# Patient Record
Sex: Female | Born: 1970 | Race: Black or African American | Hispanic: No | Smoking: Never smoker
Health system: Southern US, Community
[De-identification: ages and names within clinical notes are randomized; demographics above are authoritative.]

## PROBLEM LIST (undated history)

## (undated) DIAGNOSIS — K56609 Unspecified intestinal obstruction, unspecified as to partial versus complete obstruction: Secondary | ICD-10-CM

## (undated) HISTORY — PX: OTHER SURGICAL HISTORY: SHX169

## (undated) HISTORY — PX: GASTRIC BYPASS: SHX52

---

## 2012-12-11 ENCOUNTER — Emergency Department: Payer: Self-pay | Admitting: Emergency Medicine

## 2012-12-11 LAB — URINALYSIS, COMPLETE
Bacteria: NEGATIVE
Blood: NEGATIVE
Ketone: NEGATIVE
Nitrite: NEGATIVE
Ph: 5 (ref 4.5–8.0)
RBC,UR: NONE SEEN /HPF (ref 0–5)
Specific Gravity: 1.036 (ref 1.003–1.030)
Squamous Epithelial: NONE SEEN
WBC UR: NONE SEEN /HPF (ref 0–5)

## 2012-12-11 LAB — COMPREHENSIVE METABOLIC PANEL
Albumin: 3.5 g/dL (ref 3.4–5.0)
Alkaline Phosphatase: 80 U/L (ref 50–136)
Anion Gap: 5 — ABNORMAL LOW (ref 7–16)
BUN: 7 mg/dL (ref 7–18)
Bilirubin,Total: <0.1 mg/dL — ABNORMAL LOW (ref 0.2–1.0)
Calcium, Total: 8.6 mg/dL (ref 8.5–10.1)
Co2: 26 mmol/L (ref 21–32)
Creatinine: 0.68 mg/dL (ref 0.60–1.30)
EGFR (African American): >60
EGFR (Non-African Amer.): >60
Osmolality: 281 (ref 275–301)
Potassium: 3.9 mmol/L (ref 3.5–5.1)
SGOT(AST): 30 U/L (ref 15–37)
Sodium: 141 mmol/L (ref 136–145)
Total Protein: 7.3 g/dL (ref 6.4–8.2)

## 2012-12-11 LAB — CBC
HCT: 36 % (ref 35.0–47.0)
MCH: 27.8 pg (ref 26.0–34.0)
RBC: 4.28 10*6/uL (ref 3.80–5.20)
RDW: 14 % (ref 11.5–14.5)
WBC: 9.8 10*3/uL (ref 3.6–11.0)

## 2012-12-11 LAB — LIPASE, BLOOD: Lipase: 122 U/L (ref 73–393)

## 2012-12-26 ENCOUNTER — Emergency Department: Payer: Self-pay | Admitting: Emergency Medicine

## 2012-12-26 LAB — DRUG SCREEN, URINE
Amphetamines, Ur Screen: NEGATIVE (ref ?–1000)
Barbiturates, Ur Screen: NEGATIVE (ref ?–200)
Cannabinoid 50 Ng, Ur ~~LOC~~: NEGATIVE (ref ?–50)
Cocaine Metabolite,Ur ~~LOC~~: NEGATIVE (ref ?–300)
MDMA (Ecstasy)Ur Screen: NEGATIVE (ref ?–500)
Methadone, Ur Screen: NEGATIVE (ref ?–300)
Phencyclidine (PCP) Ur S: NEGATIVE (ref ?–25)
Tricyclic, Ur Screen: NEGATIVE (ref ?–1000)

## 2012-12-26 LAB — URINALYSIS, COMPLETE
Bilirubin,UR: NEGATIVE
Blood: NEGATIVE
Hyaline Cast: 2
Leukocyte Esterase: NEGATIVE
Nitrite: NEGATIVE
Ph: 6 (ref 4.5–8.0)
Protein: 30
Specific Gravity: 1.019 (ref 1.003–1.030)

## 2012-12-26 LAB — COMPREHENSIVE METABOLIC PANEL
Alkaline Phosphatase: 83 U/L (ref 50–136)
Anion Gap: 7 (ref 7–16)
BUN: 6 mg/dL — ABNORMAL LOW (ref 7–18)
Bilirubin,Total: 0.2 mg/dL (ref 0.2–1.0)
Chloride: 108 mmol/L — ABNORMAL HIGH (ref 98–107)
Co2: 26 mmol/L (ref 21–32)
Creatinine: 0.66 mg/dL (ref 0.60–1.30)
EGFR (Non-African Amer.): >60
Glucose: 97 mg/dL (ref 65–99)
Osmolality: 279 (ref 275–301)
Potassium: 3.8 mmol/L (ref 3.5–5.1)
SGPT (ALT): 42 U/L (ref 12–78)
Sodium: 141 mmol/L (ref 136–145)

## 2012-12-26 LAB — ETHANOL
Ethanol %: 0.08 % (ref 0.000–0.080)
Ethanol: 80 mg/dL

## 2012-12-26 LAB — PREGNANCY, URINE: Pregnancy Test, Urine: NEGATIVE m[IU]/mL

## 2012-12-26 LAB — CBC
HCT: 36.2 % (ref 35.0–47.0)
HGB: 12.3 g/dL (ref 12.0–16.0)
MCH: 28.8 pg (ref 26.0–34.0)
MCHC: 33.9 g/dL (ref 32.0–36.0)
RDW: 14.3 % (ref 11.5–14.5)

## 2012-12-26 LAB — TSH: Thyroid Stimulating Horm: 0.846 u[IU]/mL

## 2013-06-06 ENCOUNTER — Emergency Department (HOSPITAL_COMMUNITY): Payer: Managed Care, Other (non HMO)

## 2013-06-06 ENCOUNTER — Encounter (HOSPITAL_COMMUNITY): Payer: Self-pay | Admitting: Emergency Medicine

## 2013-06-06 ENCOUNTER — Emergency Department (HOSPITAL_COMMUNITY)
Admission: EM | Admit: 2013-06-06 | Discharge: 2013-06-06 | Disposition: A | Discharge status: Home / Self Care | Payer: Managed Care, Other (non HMO) | Attending: Emergency Medicine | Admitting: Emergency Medicine

## 2013-06-06 DIAGNOSIS — R1013 Epigastric pain: Secondary | ICD-10-CM

## 2013-06-06 DIAGNOSIS — Z8719 Personal history of other diseases of the digestive system: Secondary | ICD-10-CM | POA: Insufficient documentation

## 2013-06-06 DIAGNOSIS — N390 Urinary tract infection, site not specified: Secondary | ICD-10-CM | POA: Insufficient documentation

## 2013-06-06 DIAGNOSIS — Z3202 Encounter for pregnancy test, result negative: Secondary | ICD-10-CM | POA: Insufficient documentation

## 2013-06-06 DIAGNOSIS — Z79899 Other long term (current) drug therapy: Secondary | ICD-10-CM | POA: Insufficient documentation

## 2013-06-06 HISTORY — DX: Unspecified intestinal obstruction, unspecified as to partial versus complete obstruction: K56.609

## 2013-06-06 LAB — URINE MICROSCOPIC-ADD ON

## 2013-06-06 LAB — COMPREHENSIVE METABOLIC PANEL
ALT: 17 U/L (ref 0–35)
AST: 19 U/L (ref 0–37)
Albumin: 4 g/dL (ref 3.5–5.2)
Alkaline Phosphatase: 76 U/L (ref 39–117)
BUN: 9 mg/dL (ref 6–23)
CALCIUM: 9.2 mg/dL (ref 8.4–10.5)
CO2: 25 mEq/L (ref 19–32)
CREATININE: 0.77 mg/dL (ref 0.50–1.10)
Chloride: 107 mEq/L (ref 96–112)
GFR calc non Af Amer: >90 mL/min (ref >90)
GLUCOSE: 91 mg/dL (ref 70–99)
Potassium: 4 mEq/L (ref 3.7–5.3)
SODIUM: 145 meq/L (ref 137–147)
TOTAL PROTEIN: 7.8 g/dL (ref 6.0–8.3)
Total Bilirubin: <0.2 mg/dL — ABNORMAL LOW (ref 0.3–1.2)

## 2013-06-06 LAB — CBC WITH DIFFERENTIAL/PLATELET
Basophils Absolute: 0 10*3/uL (ref 0.0–0.1)
Basophils Relative: 0 % (ref 0–1)
EOS ABS: 0.1 10*3/uL (ref 0.0–0.7)
Eosinophils Relative: 1 % (ref 0–5)
HCT: 32.4 % — ABNORMAL LOW (ref 36.0–46.0)
Hemoglobin: 10.5 g/dL — ABNORMAL LOW (ref 12.0–15.0)
Lymphocytes Relative: 34 % (ref 12–46)
Lymphs Abs: 2.8 10*3/uL (ref 0.7–4.0)
MCH: 26.1 pg (ref 26.0–34.0)
MCHC: 32.4 g/dL (ref 30.0–36.0)
MCV: 80.4 fL (ref 78.0–100.0)
MONOS PCT: 8 % (ref 3–12)
Monocytes Absolute: 0.6 10*3/uL (ref 0.1–1.0)
Neutro Abs: 4.7 10*3/uL (ref 1.7–7.7)
Neutrophils Relative %: 57 % (ref 43–77)
PLATELETS: 240 10*3/uL (ref 150–400)
RBC: 4.03 MIL/uL (ref 3.87–5.11)
RDW: 14.3 % (ref 11.5–15.5)
WBC: 8.2 10*3/uL (ref 4.0–10.5)

## 2013-06-06 LAB — URINALYSIS, ROUTINE W REFLEX MICROSCOPIC
Bilirubin Urine: NEGATIVE
Glucose, UA: NEGATIVE mg/dL
Hgb urine dipstick: NEGATIVE
Ketones, ur: NEGATIVE mg/dL
NITRITE: NEGATIVE
PROTEIN: NEGATIVE mg/dL
Specific Gravity, Urine: 1.021 (ref 1.005–1.030)
UROBILINOGEN UA: 0.2 mg/dL (ref 0.0–1.0)
pH: 6.5 (ref 5.0–8.0)

## 2013-06-06 LAB — LIPASE, BLOOD: LIPASE: 22 U/L (ref 11–59)

## 2013-06-06 LAB — POCT PREGNANCY, URINE: PREG TEST UR: NEGATIVE

## 2013-06-06 MED ORDER — RANITIDINE HCL 300 MG PO TABS: 300.0000 mg | ORAL_TABLET | Freq: Two times a day (BID) | ORAL | Status: AC

## 2013-06-06 MED ORDER — MORPHINE SULFATE 4 MG/ML IJ SOLN
4.0000 mg | Freq: Once | INTRAMUSCULAR | Status: AC
  Administered 2013-06-06: 4 mg via INTRAVENOUS
  Filled 2013-06-06: qty 1

## 2013-06-06 MED ORDER — FAMOTIDINE IN NACL 20-0.9 MG/50ML-% IV SOLN
20.0000 mg | Freq: Once | INTRAVENOUS | Status: DC
Start: 2013-06-06 — End: 2013-06-06
  Filled 2013-06-06: qty 50

## 2013-06-06 MED ORDER — OMEPRAZOLE 20 MG PO CPDR: 20.0000 mg | DELAYED_RELEASE_CAPSULE | Freq: Every day | ORAL | Status: AC

## 2013-06-06 MED ORDER — SULFAMETHOXAZOLE-TRIMETHOPRIM 800-160 MG PO TABS: 1.0000 | ORAL_TABLET | Freq: Two times a day (BID) | ORAL | Status: AC

## 2013-06-06 MED ORDER — METOCLOPRAMIDE HCL 5 MG/ML IJ SOLN
10.0000 mg | Freq: Once | INTRAMUSCULAR | Status: AC
  Administered 2013-06-06: 10 mg via INTRAVENOUS
  Filled 2013-06-06: qty 2

## 2013-06-06 MED ORDER — SODIUM CHLORIDE 0.9 % IV SOLN
Freq: Once | INTRAVENOUS | Status: AC
  Administered 2013-06-06: 17:00:00 via INTRAVENOUS

## 2013-06-06 MED ORDER — GI COCKTAIL ~~LOC~~
30.0000 mL | Freq: Once | ORAL | Status: AC
  Administered 2013-06-06: 30 mL via ORAL
  Filled 2013-06-06: qty 30

## 2013-06-06 NOTE — Discharge Instructions (Signed)
Abdominal Pain, Adult °Many things can cause abdominal pain. Usually, abdominal pain is not caused by a disease and will improve without treatment. It can often be observed and treated at home. Your health care provider will do a physical exam and possibly order blood tests and X-rays to help determine the seriousness of your pain. However, in many cases, more time must pass before a clear cause of the pain can be found. Before that point, your health care provider may not know if you need more testing or further treatment. °HOME CARE INSTRUCTIONS  °Monitor your abdominal pain for any changes. The following actions may help to alleviate any discomfort you are experiencing: °· Only take over-the-counter or prescription medicines as directed by your health care provider. °· Do not take laxatives unless directed to do so by your health care provider. °· Try a clear liquid diet (broth, tea, or water) as directed by your health care provider. Slowly move to a bland diet as tolerated. °SEEK MEDICAL CARE IF: °· You have unexplained abdominal pain. °· You have abdominal pain associated with nausea or diarrhea. °· You have pain when you urinate or have a bowel movement. °· You experience abdominal pain that wakes you in the night. °· You have abdominal pain that is worsened or improved by eating food. °· You have abdominal pain that is worsened with eating fatty foods. °SEEK IMMEDIATE MEDICAL CARE IF:  °· Your pain does not go away within 2 hours. °· You have a fever. °· You keep throwing up (vomiting). °· Your pain is felt only in portions of the abdomen, such as the right side or the left lower portion of the abdomen. °· You pass bloody or black tarry stools. °MAKE SURE YOU: °· Understand these instructions.   °· Will watch your condition.   °· Will get help right away if you are not doing well or get worse.   °Document Released: 02/10/2005 Document Revised: 02/21/2013 Document Reviewed: 01/10/2013 °ExitCare® Patient  Information ©2014 [employer] LLC. ° °Urinary Tract Infection °Urinary tract infections (UTIs) can develop anywhere along your urinary tract. Your urinary tract is your body's drainage system for removing wastes and extra water. Your urinary tract includes two kidneys, two ureters, a bladder, and a urethra. Your kidneys are a pair of bean-shaped organs. Each kidney is about the size of your fist. They are located below your ribs, one on each side of your spine. °CAUSES °Infections are caused by microbes, which are microscopic organisms, including fungi, viruses, and bacteria. These organisms are so small that they can only be seen through a microscope. Bacteria are the microbes that most commonly cause UTIs. °SYMPTOMS  °Symptoms of UTIs may vary by age and gender of the patient and by the location of the infection. Symptoms in young women typically include a frequent and intense urge to urinate and a painful, burning feeling in the bladder or urethra during urination. Older women and men are more likely to be tired, shaky, and weak and have muscle aches and abdominal pain. A fever may mean the infection is in your kidneys. Other symptoms of a kidney infection include pain in your back or sides below the ribs, nausea, and vomiting. °DIAGNOSIS °To diagnose a UTI, your caregiver will ask you about your symptoms. Your caregiver also will ask to provide a urine sample. The urine sample will be tested for bacteria and white blood cells. White blood cells are made by your body to help fight infection. °TREATMENT  °Typically, UTIs can be   treated with medication. Because most UTIs are caused by a bacterial infection, they usually can be treated with the use of antibiotics. The choice of antibiotic and length of treatment depend on your symptoms and the type of bacteria causing your infection. °HOME CARE INSTRUCTIONS °· If you were prescribed antibiotics, take them exactly as your caregiver instructs you. Finish the medication  even if you feel better after you have only taken some of the medication. °· Drink enough water and fluids to keep your urine clear or pale yellow. °· Avoid caffeine, tea, and carbonated beverages. They tend to irritate your bladder. °· Empty your bladder often. Avoid holding urine for long periods of time. °· Empty your bladder before and after sexual intercourse. °· After a bowel movement, women should cleanse from front to back. Use each tissue only once. °SEEK MEDICAL CARE IF:  °· You have back pain. °· You develop a fever. °· Your symptoms do not begin to resolve within 3 days. °SEEK IMMEDIATE MEDICAL CARE IF:  °· You have severe back pain or lower abdominal pain. °· You develop chills. °· You have nausea or vomiting. °· You have continued burning or discomfort with urination. °MAKE SURE YOU:  °· Understand these instructions. °· Will watch your condition. °· Will get help right away if you are not doing well or get worse. °Document Released: 02/10/2005 Document Revised: 11/02/2011 Document Reviewed: 06/11/2011 °ExitCare® Patient Information ©2014 [employer] LLC. ° °

## 2013-06-06 NOTE — Progress Notes (Signed)
   CARE MANAGEMENT ED NOTE 06/06/2013  Patient:  Inda MerlinBROWN,Rhyen   Account Number:  [id number]  Date Initiated:  06/06/2013  Documentation initiated by:  Radford PaxFERRERO,Koden Hunzeker  Subjective/Objective Assessment:   Patient presents to Ed with abdominal pain     Subjective/Objective Assessment Detail:     Action/Plan:   Action/Plan Detail:   Anticipated DC Date:       Status Recommendation to Physician:   Result of Recommendation:    Other ED Services  Consult Working Plan    DC Planning Services  Other  PCP issues    Choice offered to / List presented to:            Status of service:  Completed, signed off  ED Comments:   ED Comments Detail:  Patient confirms she does not have a pcp.  EDCM instructed patient to call the phone number on the back of her insurance card or go to insurance company website to help her find a pcp who is close to her and within network. Patient verbalized understanding.

## 2013-06-06 NOTE — ED Provider Notes (Signed)
CSN: [id number]     Arrival date & time 06/06/13  1543 History   First MD Initiated Contact with Patient 06/06/13 1608     Chief Complaint  Patient presents with  . Abdominal Pain   (Consider location/radiation/quality/duration/timing/severity/associated sxs/prior Treatment) Patient is a 43 y.o. female presenting with abdominal pain. The history is provided by the patient. No language interpreter was used.  Abdominal Pain Pain location:  Epigastric Pain quality: sharp   Pain radiates to:  Does not radiate Associated symptoms: dysuria   Associated symptoms: no chills, no fever, no nausea and no vomiting   Associated symptoms comment:  Sharp, severe epigastric abdominal pain that started earlier today. She attempted to eat some soup to see if that would alleviate her symptoms and pain worsened. No nausea or vomiting. She does not feel bloated. She has had a bowel obstruction in the past and is concerned this is a recurrence.    Past Medical History  Diagnosis Date  . Small bowel obstruction    Past Surgical History  Procedure Laterality Date  . Gastric bypass     History reviewed. No pertinent family history. History  Substance Use Topics  . Smoking status: Never Smoker   . Smokeless tobacco: Not on file  . Alcohol Use: No   OB History   Grav Para Term Preterm Abortions TAB SAB Ect Mult Living                 Review of Systems  Constitutional: Negative for fever and chills.  Respiratory: Negative.   Cardiovascular: Negative.   Gastrointestinal: Positive for abdominal pain. Negative for nausea and vomiting.  Genitourinary: Positive for dysuria and frequency.  Musculoskeletal: Negative.   Skin: Negative.   Neurological: Negative.     Allergies  Review of patient's allergies indicates no known allergies.  Home Medications   Current Outpatient Rx  Name  Route  Sig  Dispense  Refill  . alum & mag hydroxide-simeth (MAALOX/MYLANTA) 200-200-20 MG/5ML suspension   Oral   Take 30 mLs by mouth every 6 (six) hours as needed for indigestion or heartburn.         Marland Kitchen. buPROPion (WELLBUTRIN XL) 150 MG 24 hr tablet   Oral   Take 150 mg by mouth daily.         Marland Kitchen. gabapentin (NEURONTIN) 100 MG capsule   Oral   Take 100 mg by mouth 3 (three) times daily.          BP 133/71  Pulse 102  Temp(Src) 97.6 F (36.4 C) (Oral)  Resp 18  SpO2 100%  LMP 05/06/2013 Physical Exam  Constitutional: She is oriented to person, place, and time. She appears well-developed and well-nourished. No distress.  Pulmonary/Chest: Effort normal.  Abdominal: There is tenderness. There is no rebound and no guarding.  Epigastric tenderness to soft abdomen. Non-tender lower quadrants.   Musculoskeletal: Normal range of motion.  Neurological: She is alert and oriented to person, place, and time.  Skin: Skin is warm and dry.  Psychiatric: She has a normal mood and affect.    ED Course  Procedures (including critical care time) Labs Review Labs Reviewed  CBC WITH DIFFERENTIAL - Abnormal; Notable for the following:    Hemoglobin 10.5 (*)    HCT 32.4 (*)    All other components within normal limits  COMPREHENSIVE METABOLIC PANEL - Abnormal; Notable for the following:    Total Bilirubin <0.2 (*)    All other components within normal limits  URINALYSIS,  ROUTINE W REFLEX MICROSCOPIC - Abnormal; Notable for the following:    Leukocytes, UA SMALL (*)    All other components within normal limits  LIPASE, BLOOD  URINE MICROSCOPIC-ADD ON  POCT PREGNANCY, URINE   Imaging Review Dg Abd Acute W/chest  06/06/2013   CLINICAL DATA:  Intense abdominal pain. History of a gastric bypass surgery in 2005 and small bowel obstruction in 2013.  EXAM: ACUTE ABDOMEN SERIES (ABDOMEN 2 VIEW & CHEST 1 VIEW)  COMPARISON:  None.  FINDINGS: No bowel dilation to suggest obstruction. There are air-fluid levels on the erect view suggesting a mild adynamic ileus. There is no free air.  Surgical vascular clips  and anastomosis staples lie in the epigastric region reflecting the previous gastric surgery. The soft tissues are otherwise unremarkable.  Normal heart, mediastinum hila.  The lungs are clear.  The bony structures are unremarkable.  IMPRESSION: 1. No obstruction or free air. 2. Nonspecific air-fluid levels on the erect view. This may reflect a mild adynamic ileus. 3. Normal frontal chest radiograph.   Electronically Signed   By: Amie Portland M.D.   On: 06/06/2013 16:57    EKG Interpretation   None       MDM  No diagnosis found. 1. Epigastric abdominal pain  Her pain is significantly improved with morphine and resolved with GI cocktail. Labs and plain film reassuring -no obstruction on film. She is stable for discharge home with PCP follow up for recurrent pain. Will start on PPI, zantac prn.    Arnoldo Hooker, PA-C 06/06/13 1952

## 2013-06-06 NOTE — ED Notes (Signed)
Pt c/o mid abd pain x 1 hr.  States that it feels like when she had an SBO however pt has been having normal BMs w/ last one being this morning.  Denies vomiting.

## 2013-06-07 NOTE — ED Provider Notes (Signed)
Medical screening examination/treatment/procedure(s) were performed by non-physician practitioner and as supervising physician I was immediately available for consultation/collaboration.  EKG Interpretation   None      Medical screening examination/treatment/procedure(s) were performed by non-physician practitioner and as supervising physician I was immediately available for consultation/collaboration.  EKG Interpretation   None        Juliet RudeNathan R. Rubin PayorPickering, MD 06/07/13 0020

## 2013-12-14 ENCOUNTER — Other Ambulatory Visit: Payer: Self-pay | Admitting: Obstetrics and Gynecology

## 2013-12-14 DIAGNOSIS — Z1231 Encounter for screening mammogram for malignant neoplasm of breast: Secondary | ICD-10-CM

## 2013-12-19 ENCOUNTER — Encounter (INDEPENDENT_AMBULATORY_CARE_PROVIDER_SITE_OTHER): Payer: Self-pay

## 2013-12-19 ENCOUNTER — Ambulatory Visit
Admission: RE | Admit: 2013-12-19 | Discharge: 2013-12-19 | Disposition: A | Discharge status: Home / Self Care | Payer: Commercial Indemnity | Source: Ambulatory Visit | Attending: Obstetrics and Gynecology | Admitting: Obstetrics and Gynecology

## 2013-12-19 DIAGNOSIS — Z1231 Encounter for screening mammogram for malignant neoplasm of breast: Secondary | ICD-10-CM

## 2013-12-21 ENCOUNTER — Ambulatory Visit: Payer: Managed Care, Other (non HMO)

## 2014-01-21 ENCOUNTER — Emergency Department (HOSPITAL_COMMUNITY)
Admission: EM | Admit: 2014-01-21 | Discharge: 2014-01-21 | Discharge status: Against Medical Advice | Payer: Commercial Indemnity | Attending: Emergency Medicine | Admitting: Emergency Medicine

## 2014-01-21 ENCOUNTER — Encounter (HOSPITAL_COMMUNITY): Payer: Self-pay | Admitting: Emergency Medicine

## 2014-01-21 DIAGNOSIS — K5900 Constipation, unspecified: Secondary | ICD-10-CM | POA: Insufficient documentation

## 2014-01-21 DIAGNOSIS — F1010 Alcohol abuse, uncomplicated: Secondary | ICD-10-CM | POA: Insufficient documentation

## 2014-01-21 DIAGNOSIS — R109 Unspecified abdominal pain: Secondary | ICD-10-CM | POA: Diagnosis present

## 2014-01-21 DIAGNOSIS — R110 Nausea: Secondary | ICD-10-CM | POA: Insufficient documentation

## 2014-01-21 LAB — COMPREHENSIVE METABOLIC PANEL
ALT: 16 U/L (ref 0–35)
ANION GAP: 14 (ref 5–15)
AST: 24 U/L (ref 0–37)
Albumin: 3.4 g/dL — ABNORMAL LOW (ref 3.5–5.2)
Alkaline Phosphatase: 55 U/L (ref 39–117)
BUN: 7 mg/dL (ref 6–23)
CALCIUM: 9.2 mg/dL (ref 8.4–10.5)
CHLORIDE: 102 meq/L (ref 96–112)
CO2: 21 mEq/L (ref 19–32)
CREATININE: 0.77 mg/dL (ref 0.50–1.10)
GFR calc Af Amer: >90 mL/min (ref >90)
Glucose, Bld: 57 mg/dL — ABNORMAL LOW (ref 70–99)
Potassium: 4.3 mEq/L (ref 3.7–5.3)
Sodium: 137 mEq/L (ref 137–147)
Total Bilirubin: <0.2 mg/dL — ABNORMAL LOW (ref 0.3–1.2)
Total Protein: 7.8 g/dL (ref 6.0–8.3)

## 2014-01-21 LAB — CBC WITH DIFFERENTIAL/PLATELET
BASOS ABS: 0 10*3/uL (ref 0.0–0.1)
Basophils Relative: 0 % (ref 0–1)
EOS PCT: 1 % (ref 0–5)
Eosinophils Absolute: 0.1 10*3/uL (ref 0.0–0.7)
HEMATOCRIT: 31.7 % — AB (ref 36.0–46.0)
HEMOGLOBIN: 10.3 g/dL — AB (ref 12.0–15.0)
LYMPHS PCT: 35 % (ref 12–46)
Lymphs Abs: 2.4 10*3/uL (ref 0.7–4.0)
MCH: 25.8 pg — ABNORMAL LOW (ref 26.0–34.0)
MCHC: 32.5 g/dL (ref 30.0–36.0)
MCV: 79.4 fL (ref 78.0–100.0)
MONO ABS: 0.5 10*3/uL (ref 0.1–1.0)
MONOS PCT: 8 % (ref 3–12)
Neutro Abs: 3.7 10*3/uL (ref 1.7–7.7)
Neutrophils Relative %: 56 % (ref 43–77)
Platelets: 321 10*3/uL (ref 150–400)
RBC: 3.99 MIL/uL (ref 3.87–5.11)
RDW: 16.4 % — AB (ref 11.5–15.5)
WBC: 6.7 10*3/uL (ref 4.0–10.5)

## 2014-01-21 LAB — LIPASE, BLOOD: LIPASE: 34 U/L (ref 11–59)

## 2014-01-21 NOTE — ED Notes (Addendum)
Pt reports having burning abd pain and constipation x 2 days, hx of bowel obstruction, having nausea but no vomiting. +etoh today.

## 2014-01-21 NOTE — ED Notes (Signed)
Patients pager was found on the bench in the waiting room going off with no sign of the patient. Pager was returned.

## 2014-03-19 ENCOUNTER — Emergency Department (HOSPITAL_COMMUNITY)
Admission: EM | Admit: 2014-03-19 | Discharge: 2014-03-19 | Disposition: A | Discharge status: Home / Self Care | Payer: Self-pay | Attending: Emergency Medicine | Admitting: Emergency Medicine

## 2014-03-19 ENCOUNTER — Encounter (HOSPITAL_COMMUNITY): Payer: Self-pay | Admitting: Physical Medicine and Rehabilitation

## 2014-03-19 ENCOUNTER — Emergency Department (HOSPITAL_COMMUNITY): Payer: Self-pay

## 2014-03-19 DIAGNOSIS — N760 Acute vaginitis: Secondary | ICD-10-CM

## 2014-03-19 DIAGNOSIS — N939 Abnormal uterine and vaginal bleeding, unspecified: Secondary | ICD-10-CM

## 2014-03-19 DIAGNOSIS — O23591 Infection of other part of genital tract in pregnancy, first trimester: Secondary | ICD-10-CM | POA: Insufficient documentation

## 2014-03-19 DIAGNOSIS — E349 Endocrine disorder, unspecified: Secondary | ICD-10-CM

## 2014-03-19 DIAGNOSIS — Z3201 Encounter for pregnancy test, result positive: Secondary | ICD-10-CM | POA: Insufficient documentation

## 2014-03-19 DIAGNOSIS — Z3A01 Less than 8 weeks gestation of pregnancy: Secondary | ICD-10-CM | POA: Insufficient documentation

## 2014-03-19 DIAGNOSIS — O209 Hemorrhage in early pregnancy, unspecified: Secondary | ICD-10-CM | POA: Insufficient documentation

## 2014-03-19 DIAGNOSIS — B9689 Other specified bacterial agents as the cause of diseases classified elsewhere: Secondary | ICD-10-CM

## 2014-03-19 LAB — WET PREP, GENITAL
TRICH WET PREP: NONE SEEN
YEAST WET PREP: NONE SEEN

## 2014-03-19 LAB — POC URINE PREG, ED: Preg Test, Ur: POSITIVE — AB

## 2014-03-19 LAB — CBC
HCT: 29.2 % — ABNORMAL LOW (ref 36.0–46.0)
Hemoglobin: 9.5 g/dL — ABNORMAL LOW (ref 12.0–15.0)
MCH: 26.2 pg (ref 26.0–34.0)
MCHC: 32.5 g/dL (ref 30.0–36.0)
MCV: 80.7 fL (ref 78.0–100.0)
PLATELETS: 291 10*3/uL (ref 150–400)
RBC: 3.62 MIL/uL — ABNORMAL LOW (ref 3.87–5.11)
RDW: 18.3 % — AB (ref 11.5–15.5)
WBC: 6.1 10*3/uL (ref 4.0–10.5)

## 2014-03-19 LAB — URINALYSIS, ROUTINE W REFLEX MICROSCOPIC
BILIRUBIN URINE: NEGATIVE
Glucose, UA: NEGATIVE mg/dL
Ketones, ur: NEGATIVE mg/dL
Leukocytes, UA: NEGATIVE
Nitrite: NEGATIVE
PROTEIN: NEGATIVE mg/dL
Specific Gravity, Urine: 1.017 (ref 1.005–1.030)
Urobilinogen, UA: 0.2 mg/dL (ref 0.0–1.0)
pH: 6.5 (ref 5.0–8.0)

## 2014-03-19 LAB — HCG, QUANTITATIVE, PREGNANCY: hCG, Beta Chain, Quant, S: 903 m[IU]/mL — ABNORMAL HIGH (ref <5)

## 2014-03-19 LAB — ABO/RH
ABO/RH(D): A NEG
Antibody Screen: NEGATIVE

## 2014-03-19 LAB — URINE MICROSCOPIC-ADD ON: RBC / HPF: NONE SEEN RBC/hpf (ref <3)

## 2014-03-19 LAB — HIV ANTIBODY (ROUTINE TESTING W REFLEX): HIV 1&2 Ab, 4th Generation: NONREACTIVE

## 2014-03-19 LAB — RPR

## 2014-03-19 MED ORDER — MELOXICAM 15 MG PO TABS: 15.0000 mg | ORAL_TABLET | Freq: Every day | ORAL | Status: AC

## 2014-03-19 MED ORDER — RHO D IMMUNE GLOBULIN 1500 UNIT/2ML IJ SOSY
300.0000 ug | PREFILLED_SYRINGE | Freq: Once | INTRAMUSCULAR | Status: AC
  Administered 2014-03-19: 300 ug via INTRAMUSCULAR

## 2014-03-19 MED ORDER — OXYCODONE-ACETAMINOPHEN 5-325 MG PO TABS: 1.0000 | ORAL_TABLET | Freq: Four times a day (QID) | ORAL | Status: AC | PRN

## 2014-03-19 MED ORDER — MORPHINE SULFATE 4 MG/ML IJ SOLN
4.0000 mg | Freq: Once | INTRAMUSCULAR | Status: AC
  Administered 2014-03-19: 4 mg via INTRAVENOUS
  Filled 2014-03-19: qty 1

## 2014-03-19 MED ORDER — METRONIDAZOLE 500 MG PO TABS: 500.0000 mg | ORAL_TABLET | Freq: Two times a day (BID) | ORAL | Status: AC

## 2014-03-19 MED ORDER — SODIUM CHLORIDE 0.9 % IV BOLUS (SEPSIS)
1000.0000 mL | Freq: Once | INTRAVENOUS | Status: AC
  Administered 2014-03-19: 1000 mL via INTRAVENOUS

## 2014-03-19 MED ORDER — HYDROMORPHONE HCL 1 MG/ML IJ SOLN
1.0000 mg | Freq: Once | INTRAMUSCULAR | Status: AC
  Administered 2014-03-19: 1 mg via INTRAVENOUS
  Filled 2014-03-19: qty 1

## 2014-03-19 MED ORDER — ONDANSETRON HCL 4 MG/2ML IJ SOLN
4.0000 mg | Freq: Once | INTRAMUSCULAR | Status: AC
  Administered 2014-03-19: 4 mg via INTRAVENOUS
  Filled 2014-03-19: qty 2

## 2014-03-19 NOTE — Discharge Instructions (Signed)
[facility] for repeat blood work. Go there sooner if you are soaking through greater than 3 pads in an hour.  Abnormal Uterine Bleeding Abnormal uterine bleeding can affect women at various stages in life, including teenagers, women in their reproductive years, pregnant women, and women who have reached menopause. Several kinds of uterine bleeding are considered abnormal, including:  Bleeding or spotting between periods.   Bleeding after sexual intercourse.   Bleeding that is heavier or more than normal.   Periods that last longer than usual.  Bleeding after menopause.  Many cases of abnormal uterine bleeding are minor and simple to treat, while others are more serious. Any type of abnormal bleeding should be evaluated by your health care provider. Treatment will depend on the cause of the bleeding. HOME CARE INSTRUCTIONS Monitor your condition for any changes. The following actions may help to alleviate any discomfort you are experiencing:  Avoid the use of tampons and douches as directed by your health care provider.  Change your pads frequently. You should get regular pelvic exams and Pap tests. Keep all follow-up appointments for diagnostic tests as directed by your health care provider.  SEEK MEDICAL CARE IF:   Your bleeding lasts more than 1 week.   You feel dizzy at times.  SEEK IMMEDIATE MEDICAL CARE IF:   You pass out.   You are changing pads every 15 to 30 minutes.   You have abdominal pain.  You have a fever.   You become sweaty or weak.   You are passing large blood clots from the vagina.   You start to feel nauseous and vomit. MAKE SURE YOU:   Understand these instructions.  Will watch your condition.  Will get help right away if you are not doing well or get worse. Document Released: 05/03/2005 Document Revised: 05/08/2013 Document Reviewed: 11/30/2012 [facility] Patient  Information 2015 [place], [place]. This information is not intended to replace advice given to you by your health care provider. Make sure you discuss any questions you have with your health care provider.  Bacterial Vaginosis (Do not drink alcohol while taking the flagyl for BV) Bacterial vaginosis is a vaginal infection that occurs when the normal balance of bacteria in the vagina is disrupted. It results from an overgrowth of certain bacteria. This is the most common vaginal infection in women of childbearing age. Treatment is important to prevent complications, especially in pregnant women, as it can cause a premature delivery. CAUSES  Bacterial vaginosis is caused by an increase in harmful bacteria that are normally present in smaller amounts in the vagina. Several different kinds of bacteria can cause bacterial vaginosis. However, the reason that the condition develops is not fully understood. RISK FACTORS Certain activities or behaviors can put you at an increased risk of developing bacterial vaginosis, including:  Having a new sex partner or multiple sex partners.  Douching.  Using an intrauterine device (IUD) for contraception. Women do not get bacterial vaginosis from toilet seats, bedding, swimming pools, or contact with objects around them. SIGNS AND SYMPTOMS  Some women with bacterial vaginosis have no signs or symptoms. Common symptoms include:  Grey vaginal discharge.  A fishlike odor with discharge, especially after sexual intercourse.  Itching or burning of the vagina and vulva.  Burning or pain with urination. DIAGNOSIS  Your health care provider will take a medical history and examine the vagina for signs of bacterial vaginosis. A sample of vaginal  fluid may be taken. Your health care provider will look at this sample under a microscope to check for bacteria and abnormal cells. A vaginal pH test may also be done.  TREATMENT  Bacterial vaginosis may be treated with  antibiotic medicines. These may be given in the form of a pill or a vaginal cream. A second round of antibiotics may be prescribed if the condition comes back after treatment.  HOME CARE INSTRUCTIONS   Only take over-the-counter or prescription medicines as directed by your health care provider.  If antibiotic medicine was prescribed, take it as directed. Make sure you finish it even if you start to feel better.  Do not have sex until treatment is completed.  Tell all sexual partners that you have a vaginal infection. They should see their health care provider and be treated if they have problems, such as a mild rash or itching.  Practice safe sex by using condoms and only having one sex partner. SEEK MEDICAL CARE IF:   Your symptoms are not improving after 3 days of treatment.  You have increased discharge or pain.  You have a fever. MAKE SURE YOU:   Understand these instructions.  Will watch your condition.  Will get help right away if you are not doing well or get worse. FOR MORE INFORMATION  Centers for Disease Control and Prevention, Division of STD Prevention: SolutionApps.co.zawww.cdc.gov/std American Sexual Health Association (ASHA): www.ashastd.org  Document Released: 05/03/2005 Document Revised: 02/21/2013 Document Reviewed: 12/13/2012 [facility] ChildrenExitCare Patient Information 2015 [place]Care, [place]. This information is not intended to replace advice given to you by your health care provider. Make sure you discuss any questions you have with your health care provider.

## 2014-03-19 NOTE — ED Notes (Signed)
Patient returned from US.

## 2014-03-19 NOTE — ED Notes (Signed)
Patient not in room upon rounding.

## 2014-03-19 NOTE — ED Provider Notes (Signed)
CSN: [id number]     Arrival date & time 03/19/14  1058 History   First MD Initiated Contact with Patient 03/19/14 1228     Chief Complaint  Patient presents with  . Abdominal Pain  . Vaginal Bleeding     (Consider location/radiation/quality/duration/timing/severity/associated sxs/prior Treatment) HPI  Monica Parsons This 43 year old female who presents emergency department with chief complaint of abdominal pain and vaginal bleeding. Last menstrual period was 01/26/2014. Patient states that she missed her period last month. 5 days ago she began having lower abdominal cramping, moderate bleeding. She notes passing large clots which are worse with abdominal exertion and standing. She has had some associated nausea without vomiting. She denies diarrhea or constipation. She denies any urinary symptoms. She states she took 2 home pregnancy tests which were both positive.  History reviewed. No pertinent past medical history. History reviewed. No pertinent past surgical history. No family history on file. History  Substance Use Topics  . Smoking status: Never Smoker   . Smokeless tobacco: Not on file  . Alcohol Use: No   OB History    No data available     Review of Systems  Ten systems reviewed and are negative for acute change, except as noted in the HPI.     Allergies  Review of patient's allergies indicates no known allergies.  Home Medications   Prior to Admission medications   Not on File   BP 146/71 mmHg  Pulse 115  Temp(Src) 98.3 F (36.8 C) (Oral)  Resp 20  Ht 5\' 1"  (1.549 m)  Wt 195 lb (88.451 kg)  BMI 36.86 kg/m2  SpO2 98%  LMP 02/26/2014 Physical Exam  Constitutional: She is oriented to person, place, and time. She appears well-developed and well-nourished. No distress.  HENT:  Head: Normocephalic and atraumatic.  Eyes: Conjunctivae are normal. No scleral icterus.  Neck: Normal range of motion.  Cardiovascular: Normal rate, regular rhythm and normal heart  sounds.  Exam reveals no gallop and no friction rub.   No murmur heard. Pulmonary/Chest: Effort normal and breath sounds normal. No respiratory distress.  Abdominal: Soft. Bowel sounds are normal. She exhibits no distension and no mass. There is tenderness (suprapubic). There is no guarding.  Neurological: She is alert and oriented to person, place, and time.  Skin: Skin is warm and dry. She is not diaphoretic.    ED Course  Procedures (including critical care time) Labs Review Results for orders placed or performed during the hospital encounter of 03/19/14  Wet prep, genital  Result Value Ref Range   Yeast Wet Prep HPF POC NONE SEEN NONE SEEN   Trich, Wet Prep NONE SEEN NONE SEEN   Clue Cells Wet Prep HPF POC TOO NUMEROUS TO COUNT (A) NONE SEEN   WBC, Wet Prep HPF POC MANY (A) NONE SEEN  Urinalysis, Routine w reflex microscopic  Result Value Ref Range   Color, Urine YELLOW YELLOW   APPearance CLEAR CLEAR   Specific Gravity, Urine 1.017 1.005 - 1.030   pH 6.5 5.0 - 8.0   Glucose, UA NEGATIVE NEGATIVE mg/dL   Hgb urine dipstick TRACE (A) NEGATIVE   Bilirubin Urine NEGATIVE NEGATIVE   Ketones, ur NEGATIVE NEGATIVE mg/dL   Protein, ur NEGATIVE NEGATIVE mg/dL   Urobilinogen, UA 0.2 0.0 - 1.0 mg/dL   Nitrite NEGATIVE NEGATIVE   Leukocytes, UA NEGATIVE NEGATIVE  CBC  Result Value Ref Range   WBC 6.1 4.0 - 10.5 K/uL   RBC 3.62 (L) 3.87 - 5.11 MIL/uL  Hemoglobin 9.5 (L) 12.0 - 15.0 g/dL   HCT 16.129.2 (L) 09.636.0 - 04.546.0 %   MCV 80.7 78.0 - 100.0 fL   MCH 26.2 26.0 - 34.0 pg   MCHC 32.5 30.0 - 36.0 g/dL   RDW 40.918.3 (H) 81.111.5 - 91.415.5 %   Platelets 291 150 - 400 K/uL  hCG, quantitative, pregnancy  Result Value Ref Range   hCG, Beta Chain, Quant, S 903 (H) <5 mIU/mL  Urine microscopic-add on  Result Value Ref Range   Squamous Epithelial / LPF RARE RARE   WBC, UA 0-2 <3 WBC/hpf   RBC / HPF  <3 RBC/hpf    NO FORMED ELEMENTS SEEN ON URINE MICROSCOPIC EXAMINATION   Bacteria, UA RARE RARE   POC Urine Pregnancy, ED ([facility])  Result Value Ref Range   Preg Test, Ur POSITIVE (A) NEGATIVE  ABO/Rh  Result Value Ref Range   ABO/RH(D) A NEG    Antibody Screen NEG    Blood Bank Results Called      RESULT CALLED TO AND READ BACK WITH KAREN COLLINS, RN BY J. REED AT 1345 ON 03/19/14.     Imaging Review No results found.   EKG Interpretation None      MDM   Final diagnoses:  Vaginal bleeding  Elevated serum hCG  BV (bacterial vaginosis)    1:14 PM BP 146/71 mmHg  Pulse 115  Temp(Src) 98.3 F (36.8 C) (Oral)  Resp 20  Ht 5\' 1"  (1.549 m)  Wt 195 lb (88.451 kg)  BMI 36.86 kg/m2  SpO2 98%  LMP 02/26/2014 Patient here with vaginal bleeding. Missed menses and pregnancy. Concern for possible ectopic pregnancy versus miscarriage versus normal menses. Labs and pelvic exam are pending.   2:53 PM Pelvic exam: normal external genitalia, vulva, vagina, cervix, uterus and adnexa. Os is soft- blood and mucoid discharge. _RH. Rhogam ordered.  4:31 PM BP 130/82 mmHg  Pulse 90  Temp(Src) 98 F (36.7 C) (Oral)  Resp 20  Ht 5\' 1"  (1.549 m)  Wt 195 lb (88.451 kg)  BMI 36.86 kg/m2  SpO2 99%  LMP 02/26/2014 Patient US negative and shows no IUP. Quant hcg of 903. Will need serial quants to r/o occult ectopic. F/u in 48 hours at the MAU. I spoke with on call ob/gyn Dr. Verner MouldArnold  Somaly Marteney, PA-C 03/19/14 1808  Joya Gaskinsonald W Wickline, MD 03/21/14 [phone]

## 2014-03-19 NOTE — ED Notes (Signed)
PA at the bedside.

## 2014-03-19 NOTE — ED Notes (Signed)
Spoke with lab regarding Rhogam shot.

## 2014-03-19 NOTE — ED Notes (Signed)
Patient transported to Ultrasound without distress 

## 2014-03-19 NOTE — ED Notes (Signed)
RH Negative per Victorino DikeJennifer # [phone] with blood banks.  Call if you need Rogam!

## 2014-03-19 NOTE — ED Notes (Signed)
Pt presents to department for evaluation of diffuse abdominal cramping, and vaginal bleeding. Ongoing x1 week. States positive home pregnancy test. Denies urinary symptoms. 7/10 pain upon arrival to ED. No nausea/vomiting.

## 2014-03-20 LAB — GC/CHLAMYDIA PROBE AMP
CT Probe RNA: NEGATIVE
GC PROBE AMP APTIMA: NEGATIVE

## 2014-03-22 LAB — RH IG WORKUP (INCLUDES ABO/RH)
ABO/RH(D): A NEG
ANTIBODY SCREEN: NEGATIVE
GESTATIONAL AGE(WKS): 3
Unit division: 0

## 2014-05-22 ENCOUNTER — Emergency Department (HOSPITAL_COMMUNITY)
Admission: EM | Admit: 2014-05-22 | Discharge: 2014-05-22 | Disposition: A | Discharge status: Home / Self Care | Payer: Self-pay | Attending: Emergency Medicine | Admitting: Emergency Medicine

## 2014-05-22 ENCOUNTER — Encounter (HOSPITAL_COMMUNITY): Payer: Self-pay | Admitting: Emergency Medicine

## 2014-05-22 ENCOUNTER — Emergency Department (HOSPITAL_COMMUNITY): Payer: Self-pay

## 2014-05-22 DIAGNOSIS — R109 Unspecified abdominal pain: Secondary | ICD-10-CM

## 2014-05-22 DIAGNOSIS — F10120 Alcohol abuse with intoxication, uncomplicated: Secondary | ICD-10-CM | POA: Insufficient documentation

## 2014-05-22 DIAGNOSIS — Z79899 Other long term (current) drug therapy: Secondary | ICD-10-CM | POA: Insufficient documentation

## 2014-05-22 DIAGNOSIS — K219 Gastro-esophageal reflux disease without esophagitis: Secondary | ICD-10-CM | POA: Insufficient documentation

## 2014-05-22 DIAGNOSIS — R0602 Shortness of breath: Secondary | ICD-10-CM | POA: Insufficient documentation

## 2014-05-22 DIAGNOSIS — Z791 Long term (current) use of non-steroidal anti-inflammatories (NSAID): Secondary | ICD-10-CM | POA: Insufficient documentation

## 2014-05-22 DIAGNOSIS — Z792 Long term (current) use of antibiotics: Secondary | ICD-10-CM | POA: Insufficient documentation

## 2014-05-22 DIAGNOSIS — Z3202 Encounter for pregnancy test, result negative: Secondary | ICD-10-CM | POA: Insufficient documentation

## 2014-05-22 LAB — URINALYSIS, ROUTINE W REFLEX MICROSCOPIC
Bilirubin Urine: NEGATIVE
Glucose, UA: NEGATIVE mg/dL
HGB URINE DIPSTICK: NEGATIVE
KETONES UR: NEGATIVE mg/dL
LEUKOCYTES UA: NEGATIVE
Nitrite: NEGATIVE
PROTEIN: NEGATIVE mg/dL
Specific Gravity, Urine: 1.007 (ref 1.005–1.030)
Urobilinogen, UA: 0.2 mg/dL (ref 0.0–1.0)
pH: 5 (ref 5.0–8.0)

## 2014-05-22 LAB — CBC WITH DIFFERENTIAL/PLATELET
Basophils Absolute: 0 10*3/uL (ref 0.0–0.1)
Basophils Relative: 0 % (ref 0–1)
Eosinophils Absolute: 0.1 10*3/uL (ref 0.0–0.7)
Eosinophils Relative: 1 % (ref 0–5)
HCT: 30.4 % — ABNORMAL LOW (ref 36.0–46.0)
Hemoglobin: 9.9 g/dL — ABNORMAL LOW (ref 12.0–15.0)
LYMPHS ABS: 2.3 10*3/uL (ref 0.7–4.0)
Lymphocytes Relative: 45 % (ref 12–46)
MCH: 27.2 pg (ref 26.0–34.0)
MCHC: 32.6 g/dL (ref 30.0–36.0)
MCV: 83.5 fL (ref 78.0–100.0)
MONOS PCT: 14 % — AB (ref 3–12)
Monocytes Absolute: 0.7 10*3/uL (ref 0.1–1.0)
NEUTROS PCT: 40 % — AB (ref 43–77)
Neutro Abs: 2 10*3/uL (ref 1.7–7.7)
Platelets: 271 10*3/uL (ref 150–400)
RBC: 3.64 MIL/uL — AB (ref 3.87–5.11)
RDW: 15.6 % — AB (ref 11.5–15.5)
WBC: 5 10*3/uL (ref 4.0–10.5)

## 2014-05-22 LAB — COMPREHENSIVE METABOLIC PANEL
ALT: 35 U/L (ref 0–35)
ANION GAP: 12 (ref 5–15)
AST: 52 U/L — ABNORMAL HIGH (ref 0–37)
Albumin: 3.5 g/dL (ref 3.5–5.2)
Alkaline Phosphatase: 54 U/L (ref 39–117)
BUN: 8 mg/dL (ref 6–23)
CO2: 20 mmol/L (ref 19–32)
Calcium: 8.7 mg/dL (ref 8.4–10.5)
Chloride: 107 mEq/L (ref 96–112)
Creatinine, Ser: 0.68 mg/dL (ref 0.50–1.10)
GFR calc non Af Amer: >90 mL/min (ref >90)
Glucose, Bld: 87 mg/dL (ref 70–99)
Potassium: 3.9 mmol/L (ref 3.5–5.1)
SODIUM: 139 mmol/L (ref 135–145)
TOTAL PROTEIN: 6.8 g/dL (ref 6.0–8.3)
Total Bilirubin: 0.2 mg/dL — ABNORMAL LOW (ref 0.3–1.2)

## 2014-05-22 LAB — LIPASE, BLOOD: LIPASE: 66 U/L — AB (ref 11–59)

## 2014-05-22 LAB — PREGNANCY, URINE: Preg Test, Ur: NEGATIVE

## 2014-05-22 MED ORDER — ACETAMINOPHEN 500 MG PO TABS
1000.0000 mg | ORAL_TABLET | Freq: Once | ORAL | Status: AC
  Administered 2014-05-22: 1000 mg via ORAL
  Filled 2014-05-22: qty 2

## 2014-05-22 MED ORDER — SODIUM CHLORIDE 0.9 % IV BOLUS (SEPSIS)
1000.0000 mL | Freq: Once | INTRAVENOUS | Status: AC
  Administered 2014-05-22: 1000 mL via INTRAVENOUS

## 2014-05-22 MED ORDER — LANSOPRAZOLE 15 MG PO CPDR: 15.0000 mg | DELAYED_RELEASE_CAPSULE | Freq: Every day | ORAL | Status: AC

## 2014-05-22 MED ORDER — RANITIDINE HCL 150 MG/10ML PO SYRP
300.0000 mg | ORAL_SOLUTION | Freq: Once | ORAL | Status: AC
  Administered 2014-05-22: 300 mg via ORAL
  Filled 2014-05-22: qty 20

## 2014-05-22 NOTE — ED Provider Notes (Signed)
CSN: [id number]     Arrival date & time 05/22/14  0129 History   This chart was scribed for Monica CrumbleAdeleke Torrian Canion, MD by Monica Parsons, ED Scribe. This patient was seen in room A11C/A11C and the patient's care was started at 2:21 AM.    Chief Complaint  Patient presents with  . Gastrophageal Reflux  . Alcohol Intoxication     Patient is a 44 y.o. female presenting with GERD and intoxication. The history is provided by the patient. No language interpreter was used.  Gastrophageal Reflux Associated symptoms include abdominal pain and shortness of breath.  Alcohol Intoxication Associated symptoms include abdominal pain and shortness of breath.    HPI Comments: Monica Parsons is a 44 y.o. female with PMHx of gastric bypass in 2005 and bowel obstruction twice who presents to the Emergency Department complaining of heart burn with onset a month ago. Pt notes generalized burning chest pain.   Pt notes associated SOB, hiccups, appetite change, vomiting 2x, decreased urine output, diaphoresis, subjective fever, back cramping with onset , and burning abdominal pain with onset today.  Pt notes her mouth is burning. Pt notes pain feels like previous bowel obstruction. Pt states eating makes the pain worse. Pt has been taking TUMS and Tylenol for relief. Pt states Prevacid has helped in the past.  Pt drank 4 beers this evening, she states they were the tall version.  Pt notes EtOH use daily. Pt denies shakes or seizures with halted EtOH use. Pt denies PMHx of CVA and blood clots. Pt denies taking any blood thinners. Pt denies recent injury. Pt denies diarrhea, dysuria, urinary frequency, and hematemesis. Pt recently moved and has no PCP. She is requesting pain medication for her chronic back pain.  History reviewed. No pertinent past medical history. Past Surgical History  Procedure Laterality Date  . Gastric by pass     No family history on file. History  Substance Use Topics  . Smoking status: Never Smoker   .  Smokeless tobacco: Not on file  . Alcohol Use: Yes   OB History    No data available     Review of Systems  Constitutional: Positive for fever (subjective), diaphoresis and appetite change.  Respiratory: Positive for shortness of breath.   Gastrointestinal: Positive for vomiting and abdominal pain. Negative for diarrhea.  Genitourinary: Positive for decreased urine volume. Negative for dysuria and frequency.  Musculoskeletal: Positive for back pain.      Allergies  Review of patient's allergies indicates no known allergies.  Home Medications   Prior to Admission medications   Medication Sig Start Date End Date Taking? Authorizing Provider  ergocalciferol (VITAMIN D2) 50000 UNITS capsule Take 50,000 Units by mouth every Monday, Wednesday, and Friday.    Historical Provider, MD  ferrous sulfate 325 (65 FE) MG EC tablet Take 325 mg by mouth daily with breakfast.    Historical Provider, MD  meloxicam (MOBIC) 15 MG tablet Take 1 tablet (15 mg total) by mouth daily. Take 1 daily with food. 03/19/14   Arthor CaptainAbigail Harris, PA-C  metroNIDAZOLE (FLAGYL) 500 MG tablet Take 1 tablet (500 mg total) by mouth 2 (two) times daily. One po bid x 7 days 03/19/14   Arthor CaptainAbigail Harris, PA-C  Multiple Vitamin (MULTIVITAMIN WITH MINERALS) TABS tablet Take 1 tablet by mouth daily.    Historical Provider, MD  oxyCODONE-acetaminophen (PERCOCET) 5-325 MG per tablet Take 1-2 tablets by mouth every 6 (six) hours as needed for severe pain. 03/19/14   Arthor CaptainAbigail Harris, PA-C  BP 141/79 mmHg  Pulse 105  Resp 24  Ht  (1.549 m)  Wt 220 lb (99.791 kg)  BMI 41.59 kg/m2  SpO2 97%  LMP 05/18/2014 Physical Exam  Constitutional: She is oriented to person, place, and time. She appears well-developed and well-nourished. No distress.  Patient sleeping comfortably in the room with partner in her bed.  There is obvious alcohol odor in the room.  HENT:  Head: Normocephalic and atraumatic.  Mouth/Throat: Oropharynx is clear  and moist. No oropharyngeal exudate.  Eyes: Conjunctivae and EOM are normal. Pupils are equal, round, and reactive to light. No scleral icterus.  Neck: Normal range of motion. Neck supple. No JVD present. No tracheal deviation present. No thyromegaly present.  Cardiovascular: Normal rate, regular rhythm and normal heart sounds.  Exam reveals no gallop and no friction rub.   No murmur heard. Pulmonary/Chest: Effort normal and breath sounds normal. No respiratory distress. She has no wheezes. She exhibits no tenderness.  Abdominal: Soft. Bowel sounds are normal. She exhibits no distension and no mass. There is no tenderness. There is no rebound and no guarding.  Musculoskeletal: Normal range of motion. She exhibits no edema or tenderness.  Lymphadenopathy:    She has no cervical adenopathy.  Neurological: She is alert and oriented to person, place, and time. No cranial nerve deficit. She exhibits normal muscle tone.  Skin: Skin is warm and dry. No rash noted. She is not diaphoretic. No erythema. No pallor.  Nursing note and vitals reviewed.   ED Course  Procedures (including critical care time) DIAGNOSTIC STUDIES: Oxygen Saturation is 97% on room air, normal by my interpretation.    COORDINATION OF CARE: 2:32 AM Discussed treatment plan with patient at beside, the patient agrees with the plan and has no further questions at this time.   Labs Review Labs Reviewed  CBC WITH DIFFERENTIAL - Abnormal; Notable for the following:    RBC 3.64 (*)    Hemoglobin 9.9 (*)    HCT 30.4 (*)    RDW 15.6 (*)    Neutrophils Relative % 40 (*)    Monocytes Relative 14 (*)    All other components within normal limits  COMPREHENSIVE METABOLIC PANEL - Abnormal; Notable for the following:    AST 52 (*)    Total Bilirubin 0.2 (*)    All other components within normal limits  LIPASE, BLOOD - Abnormal; Notable for the following:    Lipase 66 (*)    All other components within normal limits  URINALYSIS,  ROUTINE W REFLEX MICROSCOPIC - Abnormal; Notable for the following:    Color, Urine STRAW (*)    APPearance CLOUDY (*)    All other components within normal limits  URINE CULTURE  PREGNANCY, URINE    Imaging Review Dg Chest 2 View  05/22/2014   CLINICAL DATA:  Generalized body aches and abdominal pain with vomiting and fever for 2 days.  EXAM: CHEST  2 VIEW  COMPARISON:  None.  FINDINGS: The heart size and mediastinal contours are within normal limits. Both lungs are clear. The visualized skeletal structures are unremarkable. Surgical clips at the EG junction.  IMPRESSION: No active cardiopulmonary disease.   Electronically Signed   By: Burman Nieves M.D.   On: 05/22/2014 03:19   Dg Abd 2 Views  05/22/2014   CLINICAL DATA:  Generalized body aches and abdominal pain with vomiting and fever for 2 days.  EXAM: ABDOMEN - 2 VIEW  COMPARISON:  None.  FINDINGS: Surgical  clips at the GE junction. The bowel gas pattern is normal. There is no evidence of free air. No radio-opaque calculi or other significant radiographic abnormality is seen.  IMPRESSION: Nonobstructive bowel gas pattern.   Electronically Signed   By: Burman Nieves M.D.   On: 05/22/2014 03:21     EKG Interpretation   Date/Time:  Wednesday May 22 2014 01:38:02 EST Ventricular Rate:  114 PR Interval:  180 QRS Duration: 78 QT Interval:  324 QTC Calculation: 446 R Axis:   33 Text Interpretation:  Sinus tachycardia Confirmed by Erroll Luna  [phone]) on 05/22/2014 2:17:59 AM      MDM   Final diagnoses:  Abdominal pain    Patient presents emergency department with multiple complaints. She essentially tells me everything is positive on review of systems. Patient's symptoms all found to be chronic. Her heartburn has been going on for a month, it is burning sensation which she states is consistent with her heartburn. Low concern for ACS in this patient giving the history. She was given ranitidine the emergency  department and will be discharged with a prescription for Prevacid which she states has worked in the past. Her abdominal complaints were evaluated with x-ray which was negative for air-fluid levels. Natasha Bence patient is resting comfortably with no episodes of vomiting in the emergency department. She was advised to continue Tylenol or Motrin at home for her back pain and follow-up with a primary physician here for continued management. Strict return precautions given. Her vital signs remain within her normal limits, there is no tachycardia on my exam and the patient is safe for discharge.  I personally performed the services described in this documentation, which was scribed in my presence. The recorded information has been reviewed and is accurate.     Monica Crumble, MD 05/22/14 [phone]

## 2014-05-22 NOTE — Discharge Instructions (Signed)
Abdominal Pain, Women Monica Parsons, you were seen today for chest pain that is consistent with reflux.  Take prevacid at home for relief.  Your xray does not show a bowel obstruction.  Follow up with a primary care physician within 3 days for continued management.  If symptoms worsen, come back to the ED for repeat evaluation. Thank you. Abdominal (stomach, pelvic, or belly) pain can be caused by many things. It is important to tell your doctor:  The location of the pain.  Does it come and go or is it present all the time?  Are there things that start the pain (eating certain foods, exercise)?  Are there other symptoms associated with the pain (fever, nausea, vomiting, diarrhea)? All of this is helpful to know when trying to find the cause of the pain. CAUSES   Stomach: virus or bacteria infection, or ulcer.  Intestine: appendicitis (inflamed appendix), regional ileitis (Crohn's disease), ulcerative colitis (inflamed colon), irritable bowel syndrome, diverticulitis (inflamed diverticulum of the colon), or cancer of the stomach or intestine.  Gallbladder disease or stones in the gallbladder.  Kidney disease, kidney stones, or infection.  Pancreas infection or cancer.  Fibromyalgia (pain disorder).  Diseases of the female organs:  Uterus: fibroid (non-cancerous) tumors or infection.  Fallopian tubes: infection or tubal pregnancy.  Ovary: cysts or tumors.  Pelvic adhesions (scar tissue).  Endometriosis (uterus lining tissue growing in the pelvis and on the pelvic organs).  Pelvic congestion syndrome (female organs filling up with blood just before the menstrual period).  Pain with the menstrual period.  Pain with ovulation (producing an egg).  Pain with an IUD (intrauterine device, birth control) in the uterus.  Cancer of the female organs.  Functional pain (pain not caused by a disease, may improve without treatment).  Psychological pain.  Depression. DIAGNOSIS  Your  doctor will decide the seriousness of your pain by doing an examination.  Blood tests.  X-rays.  Ultrasound.  CT scan (computed tomography, special type of X-ray).  MRI (magnetic resonance imaging).  Cultures, for infection.  Barium enema (dye inserted in the large intestine, to better view it with X-rays).  Colonoscopy (looking in intestine with a lighted tube).  Laparoscopy (minor surgery, looking in abdomen with a lighted tube).  Major abdominal exploratory surgery (looking in abdomen with a large incision). TREATMENT  The treatment will depend on the cause of the pain.   Many cases can be observed and treated at home.  Over-the-counter medicines recommended by your caregiver.  Prescription medicine.  Antibiotics, for infection.  Birth control pills, for painful periods or for ovulation pain.  Hormone treatment, for endometriosis.  Nerve blocking injections.  Physical therapy.  Antidepressants.  Counseling with a psychologist or psychiatrist.  Minor or major surgery. HOME CARE INSTRUCTIONS   Do not take laxatives, unless directed by your caregiver.  Take over-the-counter pain medicine only if ordered by your caregiver. Do not take aspirin because it can cause an upset stomach or bleeding.  Try a clear liquid diet (broth or water) as ordered by your caregiver. Slowly move to a bland diet, as tolerated, if the pain is related to the stomach or intestine.  Have a thermometer and take your temperature several times a day, and record it.  Bed rest and sleep, if it helps the pain.  Avoid sexual intercourse, if it causes pain.  Avoid stressful situations.  Keep your follow-up appointments and tests, as your caregiver orders.  If the pain does not go away with  medicine or surgery, you may try:  Acupuncture.  Relaxation exercises (yoga, meditation).  Group therapy.  Counseling. SEEK MEDICAL CARE IF:   You notice certain foods cause stomach  pain.  Your home care treatment is not helping your pain.  You need stronger pain medicine.  You want your IUD removed.  You feel faint or lightheaded.  You develop nausea and vomiting.  You develop a rash.  You are having side effects or an allergy to your medicine. SEEK IMMEDIATE MEDICAL CARE IF:   Your pain does not go away or gets worse.  You have a fever.  Your pain is felt only in portions of the abdomen. The right side could possibly be appendicitis. The left lower portion of the abdomen could be colitis or diverticulitis.  You are passing blood in your stools (bright red or black tarry stools, with or without vomiting).  You have blood in your urine.  You develop chills, with or without a fever.  You pass out. MAKE SURE YOU:   Understand these instructions.  Will watch your condition.  Will get help right away if you are not doing well or get worse. Document Released: 02/28/2007 Document Revised: 09/17/2013 Document Reviewed: 03/20/2009 [facility] Patient Information 2015 [place], [place]. This information is not intended to replace advice given to you by your health care provider. Make sure you discuss any questions you have with your health care provider. Chest Pain (Nonspecific) It is often hard to give a diagnosis for the cause of chest pain. There is always a chance that your pain could be related to something serious, such as a heart attack or a blood clot in the lungs. You need to follow up with your doctor. HOME CARE  If antibiotic medicine was given, take it as directed by your doctor. Finish the medicine even if you start to feel better.  For the next few days, avoid activities that bring on chest pain. Continue physical activities as told by your doctor.  Do not use any tobacco products. This includes cigarettes, chewing tobacco, and e-cigarettes.  Avoid drinking alcohol.  Only take medicine as told by your doctor.  Follow your doctor's suggestions  for more testing if your chest pain does not go away.  Keep all doctor visits you made. GET HELP IF:  Your chest pain does not go away, even after treatment.  You have a rash with blisters on your chest.  You have a fever. GET HELP RIGHT AWAY IF:   You have more pain or pain that spreads to your arm, neck, jaw, back, or belly (abdomen).  You have shortness of breath.  You cough more than usual or cough up blood.  You have very bad back or belly pain.  You feel sick to your stomach (nauseous) or throw up (vomit).  You have very bad weakness.  You pass out (faint).  You have chills. This is an emergency. Do not wait to see if the problems will go away. Call your local emergency services (911 in [place] Do not drive yourself to the hospital. MAKE SURE YOU:   Understand these instructions.  Will watch your condition.  Will get help right away if you are not doing well or get worse. Document Released: 10/20/2007 Document Revised: 05/08/2013 Document Reviewed: 10/20/2007 [facility] Patient Information 2015 [place], [place]. This information is not intended to replace advice given to you by your health care provider. Make sure you discuss any questions you have with your health care provider.

## 2014-05-22 NOTE — ED Notes (Signed)
Pt reports severe heartburn for most of the day, has hx of bowel obstruction. C/O nausea and vomiting x 3 today, states emesis is dark. Denies diarrhea, states she has been able to keep some fluids down today. NAD, pt alert, oriented, respirations equal and unlabored.

## 2014-05-22 NOTE — ED Notes (Signed)
Pt. reports gastric reflux for 2 weeks , nausea and vomitting /poor appetite , pt. also stated anxiety and ETOH binge drinking today .

## 2014-05-23 LAB — URINE CULTURE
CULTURE: NO GROWTH
Colony Count: NO GROWTH

## 2014-07-16 ENCOUNTER — Encounter (HOSPITAL_COMMUNITY): Payer: Self-pay | Admitting: Emergency Medicine

## 2014-08-13 ENCOUNTER — Encounter (HOSPITAL_COMMUNITY): Payer: Self-pay | Admitting: Emergency Medicine

## 2014-08-13 ENCOUNTER — Emergency Department (HOSPITAL_COMMUNITY)
Admission: EM | Admit: 2014-08-13 | Discharge: 2014-08-13 | Disposition: A | Discharge status: Home / Self Care | Payer: Managed Care, Other (non HMO) | Attending: Emergency Medicine | Admitting: Emergency Medicine

## 2014-08-13 DIAGNOSIS — Z79899 Other long term (current) drug therapy: Secondary | ICD-10-CM | POA: Insufficient documentation

## 2014-08-13 DIAGNOSIS — Z8719 Personal history of other diseases of the digestive system: Secondary | ICD-10-CM | POA: Insufficient documentation

## 2014-08-13 DIAGNOSIS — S0591XA Unspecified injury of right eye and orbit, initial encounter: Secondary | ICD-10-CM | POA: Diagnosis present

## 2014-08-13 DIAGNOSIS — Y998 Other external cause status: Secondary | ICD-10-CM | POA: Insufficient documentation

## 2014-08-13 DIAGNOSIS — Z792 Long term (current) use of antibiotics: Secondary | ICD-10-CM | POA: Insufficient documentation

## 2014-08-13 DIAGNOSIS — S058X1A Other injuries of right eye and orbit, initial encounter: Secondary | ICD-10-CM | POA: Diagnosis not present

## 2014-08-13 DIAGNOSIS — Y92838 Other recreation area as the place of occurrence of the external cause: Secondary | ICD-10-CM | POA: Diagnosis not present

## 2014-08-13 DIAGNOSIS — H209 Unspecified iridocyclitis: Secondary | ICD-10-CM

## 2014-08-13 DIAGNOSIS — Y9364 Activity, baseball: Secondary | ICD-10-CM | POA: Insufficient documentation

## 2014-08-13 DIAGNOSIS — W2107XA Struck by softball, initial encounter: Secondary | ICD-10-CM | POA: Diagnosis not present

## 2014-08-13 MED ORDER — PREDNISOLONE ACETATE 1 % OP SUSP
1.0000 [drp] | Freq: Once | OPHTHALMIC | Status: AC
  Administered 2014-08-13: 1 [drp] via OPHTHALMIC
  Filled 2014-08-13: qty 1

## 2014-08-13 MED ORDER — HYDROCODONE-ACETAMINOPHEN 5-325 MG PO TABS
1.0000 | ORAL_TABLET | Freq: Once | ORAL | Status: AC
  Administered 2014-08-13: 1 via ORAL
  Filled 2014-08-13: qty 1

## 2014-08-13 MED ORDER — ATROPINE SULFATE 1 % OP SOLN
1.0000 [drp] | Freq: Once | OPHTHALMIC | Status: AC
  Administered 2014-08-13: 1 [drp] via OPHTHALMIC
  Filled 2014-08-13: qty 2

## 2014-08-13 MED ORDER — NAPROXEN 500 MG PO TABS: 500.0000 mg | ORAL_TABLET | Freq: Two times a day (BID) | ORAL | Status: AC

## 2014-08-13 MED ORDER — HYDROCODONE-ACETAMINOPHEN 5-325 MG PO TABS: 1.0000 | ORAL_TABLET | ORAL | Status: AC | PRN

## 2014-08-13 MED ORDER — CYCLOPENTOLATE HCL 1 % OP SOLN
1.0000 [drp] | Freq: Once | OPHTHALMIC | Status: AC
  Administered 2014-08-13: 1 [drp] via OPHTHALMIC
  Filled 2014-08-13: qty 2

## 2014-08-13 MED ORDER — TETRACAINE HCL 0.5 % OP SOLN
1.0000 [drp] | Freq: Once | OPHTHALMIC | Status: AC
Start: 2014-08-13 — End: 2014-08-13
  Administered 2014-08-13: 1 [drp] via OPHTHALMIC

## 2014-08-13 MED ORDER — FLUORESCEIN SODIUM 1 MG OP STRP
1.0000 | ORAL_STRIP | Freq: Once | OPHTHALMIC | Status: AC
  Administered 2014-08-13: 1 via OPHTHALMIC
  Filled 2014-08-13: qty 1

## 2014-08-13 NOTE — ED Provider Notes (Signed)
CSN: [id number]     Arrival date & time 08/13/14  2048 History   First MD Initiated Contact with Patient 08/13/14 2126     Chief Complaint  Patient presents with  . Eye Injury     (Consider location/radiation/quality/duration/timing/severity/associated sxs/prior Treatment) HPI Comments: Patient is a 44 y/o female with a hx of SBO who presents to the ED for further evaluation of R eye pain. Patient states that she was hit in the eye with a softball at 5pm on Sunday evening (2 nights ago). Patient reports a constant, worsening pain in the R eye for which she has taken no medications. Pain is worse with eye movement to the right. She states she has seen spots on the lateral periphery of her R eye since the injury. Patient reports clear tearing from the eye which began today. She states her pain has made it difficult for her to walk. Patient is a poor historian; speech is slowed and patient appears intoxicated. Taxi driver in the ED with the patient who reports patient has been drinking ETOH and they stopped at the [facility] store prior to ED arrival.  Patient is a 44 y.o. female presenting with eye injury. The history is provided by the patient. No language interpreter was used.  Eye Injury    Past Medical History  Diagnosis Date  . Small bowel obstruction    Past Surgical History  Procedure Laterality Date  . Gastric bypass    . Gastric by pass     History reviewed. No pertinent family history. History  Substance Use Topics  . Smoking status: Never Smoker   . Smokeless tobacco: Not on file  . Alcohol Use: Yes   OB History    Gravida Para Term Preterm AB TAB SAB Ectopic Multiple Living   0 0 0 0 0 0 0 0        Review of Systems  Eyes: Positive for photophobia, pain, discharge, redness and visual disturbance.  All other systems reviewed and are negative.   Allergies  Review of patient's allergies indicates no known allergies.  Home Medications   Prior to Admission medications    Medication Sig Start Date End Date Taking? Authorizing Provider  alum & mag hydroxide-simeth (MAALOX/MYLANTA) 200-200-20 MG/5ML suspension Take 30 mLs by mouth every 6 (six) hours as needed for indigestion or heartburn.    Historical Provider, MD  buPROPion (WELLBUTRIN XL) 150 MG 24 hr tablet Take 150 mg by mouth daily.    Historical Provider, MD  ergocalciferol (VITAMIN D2) 50000 UNITS capsule Take 50,000 Units by mouth every Monday, Wednesday, and Friday.    Historical Provider, MD  ferrous sulfate 325 (65 FE) MG EC tablet Take 325 mg by mouth daily with breakfast.    Historical Provider, MD  gabapentin (NEURONTIN) 100 MG capsule Take 100 mg by mouth 3 (three) times daily.    Historical Provider, MD  lansoprazole (PREVACID) 15 MG capsule Take 1 capsule (15 mg total) by mouth daily at 12 noon. 05/22/14   Tomasita Crumble, MD  meloxicam (MOBIC) 15 MG tablet Take 1 tablet (15 mg total) by mouth daily. Take 1 daily with food. Patient not taking: Reported on 05/22/2014 03/19/14   Arthor Captain, PA-C  metroNIDAZOLE (FLAGYL) 500 MG tablet Take 1 tablet (500 mg total) by mouth 2 (two) times daily. One po bid x 7 days Patient not taking: Reported on 05/22/2014 03/19/14   Arthor Captain, PA-C  Multiple Vitamin (MULTIVITAMIN WITH MINERALS) TABS tablet Take 1 tablet by  mouth daily.    Historical Provider, MD  omeprazole (PRILOSEC) 20 MG capsule Take 1 capsule (20 mg total) by mouth daily. 06/06/13   Elpidio AnisShari Upstill, PA-C  oxyCODONE-acetaminophen (PERCOCET) 5-325 MG per tablet Take 1-2 tablets by mouth every 6 (six) hours as needed for severe pain. Patient not taking: Reported on 05/22/2014 03/19/14   Arthor CaptainAbigail Harris, PA-C  ranitidine (ZANTAC) 300 MG tablet Take 1 tablet (300 mg total) by mouth 2 (two) times daily. 06/06/13   Elpidio AnisShari Upstill, PA-C  sulfamethoxazole-trimethoprim (SEPTRA DS) 800-160 MG per tablet Take 1 tablet by mouth every 12 (twelve) hours. 06/06/13   Shari Upstill, PA-C   BP 147/88 mmHg  Pulse 88  Temp(Src)  97.7 F (36.5 C) (Oral)  Resp 16  SpO2 98%  LMP 06/21/2014 (Exact Date)   Physical Exam  Constitutional: She is oriented to person, place, and time. She appears well-developed and well-nourished. No distress.  Nontoxic/nonseptic appearing  HENT:  Head: Normocephalic and atraumatic.  Mouth/Throat: Oropharynx is clear and moist. No oropharyngeal exudate.  Eyes: EOM are normal. Pupils are equal, round, and reactive to light. Lids are everted and swept, no foreign bodies found. Right conjunctiva is injected. Right conjunctiva has no hemorrhage. No scleral icterus.  Fundoscopic exam:      The right eye shows no hemorrhage. The right eye shows red reflex.  Slit lamp exam:      The right eye shows no corneal abrasion, no corneal flare, no corneal ulcer, no foreign body and no fluorescein uptake.  Snellen 20/50 OS, OD, OU R eye injected without proptosis or hyphema. Negative Sidel's sign on fluorescein staining. No evidence of corneal abrasion or ulcer. No consensual photophobia. Normal EOMs. All visual fields intact. IOP 15 OD with 95% CI. No periorbital swelling or edema.  Neck: Normal range of motion.  Pulmonary/Chest: Effort normal. No respiratory distress.  Musculoskeletal: Normal range of motion.  Neurological: She is alert and oriented to person, place, and time.  Skin: Skin is warm and dry. No rash noted. She is not diaphoretic. No erythema. No pallor.  Psychiatric: Her speech is slurred. She is slowed.  Nursing note and vitals reviewed.   ED Course  Procedures (including critical care time) Labs Review Labs Reviewed - No data to display  Imaging Review No results found.   EKG Interpretation None      Medications  cyclopentolate (CYCLODRYL,CYCLOGYL) 1 % ophthalmic solution 1 drop (not administered)  atropine 1 % ophthalmic solution 1 drop (not administered)  prednisoLONE acetate (PRED FORTE) 1 % ophthalmic suspension 1 drop (not administered)  HYDROcodone-acetaminophen  (NORCO/VICODIN) 5-325 MG per tablet 1 tablet (not administered)  tetracaine (PONTOCAINE) 0.5 % ophthalmic solution 1 drop (1 drop Right Eye Given 08/13/14 2136)  fluorescein ophthalmic strip 1 strip (1 strip Both Eyes Given 08/13/14 2136)    MDM   Final diagnoses:  Traumatic iritis    44 year old female presents to the emergency department for further evaluation of right eye pain x 2 days after being hit with a softball. Physical exam consistent with traumatic iritis. No evidence of retrobulbar hematoma. Doubt orbital fracture. No evidence of globe rupture. No proptosis or hyphema. No consensual photophobia to suggest uveitis.   Patient treated in ED with drop of cyclopentolate and atropine. Patient given Pred forte to use QID for symptoms. Will also discharge with naproxen and abbreviated course of Norco for severe pain. Ophthalmology referral provided for follow-up in 1-2 days. Return precautions given. Patient agreeable to plan with no unaddressed concerns.  Patient discharged in good condition.   Filed Vitals:   08/13/14 2115  BP: 147/88  Pulse: 88  Temp: 97.7 F (36.5 C)  TempSrc: Oral  Resp: 16  SpO2: 98%     Antony Madura, PA-C 08/13/14 2221  Eber Hong, MD 08/13/14 [phone]

## 2014-08-13 NOTE — ED Notes (Signed)
Pt states she got hit in the right eye with a softball after someone hit it with a bat

## 2014-08-13 NOTE — ED Provider Notes (Signed)
The patient reports that she was struck in the eye with a softball 2 days ago, has had ongoing redness of the eye, no abrasion on floor seen exam, normal pupillary response, minimal consensual pain, recently her a flare is present, the patient has no periorbital tenderness or bruising, no orbital rim tenderness, likely has a traumatic iritis, meds, follow-up with ophthalmology.  Medical screening examination/treatment/procedure(s) were conducted as a shared visit with non-physician practitioner(s) and myself.  I personally evaluated the patient during the encounter.  Clinical Impression:   Final diagnoses:  Traumatic iritis         Eber HongBrian Tyke Outman, MD 08/13/14 [phone]

## 2014-08-13 NOTE — Discharge Instructions (Signed)
Use Pred-Forte drops every 6 hours for pain. Take Naproxen as needed for persistent mild-moderate pain. You may take Hydrocodone as needed for severe pain. Follow up with an eye doctor in 1-2 days.  Iritis Iritis is an inflammation of the colored part of the eye (iris). Other parts at the front of the eye may also be inflamed. The iris is part of the middle layer of the eyeball which is called the uvea or the uveal track. Any part of the uveal track can become inflamed. The other portions of the uveal track are the choroid (the thin membrane under the outer layer of the eye), and the ciliary body (joins the choroid and the iris and produces the fluid in the front of the eye).  It is extremely important to treat iritis early, as it may lead to internal eye damage causing scarring or diseases such as glaucoma. Some people have only one attack of iritis (in one or both eyes) in their lifetime, while others may get it many times. CAUSES Iritis can be associated with many different diseases, but mostly occurs in otherwise healthy people. Examples of diseases that can be associated with iritis include:  Diseases where the body's immune system attacks tissues within your own body (autoimmune diseases).  Infections (tuberculosis, gonorrhea, fungus infections, Lyme disease, infection of the lining of the heart).  Trauma or injury.  Eye diseases (acute glaucoma and others).  Inflammation from other parts of the uveal track.  Severe eye infections.  Other rare diseases. SYMPTOMS  Eye pain or aching.  Sensitivity to light.  Loss of sight or blurred vision.  Redness of the eye. This is often accompanied by a ring of redness around the outside of the cornea, or clear covering at the front of the eye (ciliary flush).  Excessive tearing of the eye(s).  A small pupil that does not enlarge in the dark and stays smaller than the other eye's pupil.  A whitish area that obscures the lower part of the  colored circular iris. Sometimes this is visible when looking at the eye, where the whitish area has a "fluid level" or flat top. This is called a "hypopyon" and is actually pus inside the eye. Since iritis causes the eye to become red, it is often confused with a much less dangerous form of "pink eye" or conjunctivitis. One of the most important symptoms is sensitivity to light. Anytime there is redness, discomfort in the eye(s) and extreme light sensitivity, it is extremely important to see an ophthalmologist as soon as possible. TREATMENT Acute iritis requires prompt medical evaluation by an eye specialist (ophthalmologist.) Treatment depends on the underlying cause but may include:  Corticosteroid eye drops and dilating eye drops. Follow your caregiver's exact instructions on taking and stopping corticosteroid medications (drops or pills).  Occasionally, the iritis will be so severe that it will not respond to commonly used medications. If this happens, it may be necessary to use steroid injections. The injections are given under the eye's outer surface. Sometimes oral medications are given. The decision on treatment used for iritis is usually made on an individual basis. HOME CARE INSTRUCTIONS Your care giver will give specific instructions regarding the use of eye medications or other medications. Be certain to follow all instructions in both taking and stopping the medications. SEEK IMMEDIATE MEDICAL CARE IF:  You have redness of one or both eye.  You experience a great deal of light sensitivity.  You have pain or aching in either eye.  MAKE SURE YOU:   Understand these instructions.  Will watch your condition.  Will get help right away if you are not doing well or get worse. Document Released: 05/03/2005 Document Revised: 07/26/2011 Document Reviewed: 10/21/2006 [facility] Patient Information 2015 [place], [place]. This information is not intended to replace advice given to you by your  health care provider. Make sure you discuss any questions you have with your health care provider.

## 2014-09-06 NOTE — Consult Note (Signed)
PATIENT NAME:  Monica Parsons, HEBEL MR#:  [id number] DATE OF BIRTH:  09-13-1970  DATE OF CONSULTATION:  12/26/2012  REFERRING PHYSICIAN:  Humberto Leep. Margarita Grizzle, MD CONSULTING PHYSICIAN:  Ardeen Fillers. Garnetta Buddy, MD  REASON FOR ADMISSION: Severe depression.   HISTORY OF PRESENT ILLNESS: The patient is a 44 year old African American female who presented to the ER accompanied by her friend, as she was complaining of having severe abdominal pain and severe depression. Psychiatric consult was placed because of severe depression and unable to contract for safety, as she was having passive suicidal thoughts. During my interview, the patient was lying in the bed. She reported that she has been having severe depression and is becoming progressively depressed for the past 1 month. She reported that she has poor sleep,  has anhedonia, feeling hopeless, helpless and crying spells. She reported that she is unable to eat because she is having severe abdominal pain and might be having a severe ulcer. The patient reported that she currently follows with Dr. Mervyn Skeeters at [facility] and he has been treating her depression. She been taking Celexa 40 mg and he has recently started her on Abilify. She reported that he titrated the dose of Abilify to 5 mg 2 weeks ago. She feels that there is something heavy sitting on her chest for the past 2 weeks. The patient reported that she cannot rest comfortably and needs medication to control her anxiety. The patient appeared in pain during my interview. She reported that she wants help at this time. She currently denied having any perceptual disturbances.   PAST PSYCHIATRIC HISTORY: The patient reported that she has been following with Dr. Mervyn Skeeters at [facility] and he has been treating her for depression and anxiety. She is currently taking Celexa and Abilify. She reported no previous suicide attempts and has never been hospitalized to a psychiatric hospital.   PAST MEDICAL HISTORY: The patient reported history of gastric  bypass 5 years ago. Reported that she has history of bowel obstruction as well as adhesions in the past. Reported that she has to eat small meals because of her history of gastric bypass. She initially lost 130 pounds, but now she has gained 30 pounds back.   CURRENT HOME MEDICATIONS: Hydrochlorothiazide 12.5 mg p.o. daily, Celexa 40 mg p.o. daily, Abilify 5 mg p.o. daily.   ALLERGIES: No known drug allergies.   SOCIAL HISTORY: The patient reported that she is currently separated and has 3 children. Her husband take care of the children. She currently lives in a women's home. She reported that this is not a shelter but like a shelter.   FAMILY HISTORY: The patient reported history of depression in her father and sister. They both live in [place] and she does not know if they are taking any psychotropic medications. She denied any history of suicide in her family.   ANCILLARY DATA:  VITAL SIGNS: Temperature 98.1, pulse 100, respirations 18, blood pressure 159/81.  LABORATORY DATA: Glucose 97, BUN 6, creatinine 0.66, sodium 141, potassium 3.8, chloride 108, bicarbonate 26, anion gap 7, osmolality 279, calcium 9.0. Blood alcohol level 80. Protein 7.8, albumin 3.5, bilirubin 0.2, alkaline phosphatase 83, AST 48, ALT 42. TSH 0.846. WBC 7.9, RBC 4.26, hemoglobin 12.3, hematocrit 36.2, platelet count 255, MCV 85, RDW 14.3.   REVIEW OF SYSTEMS:    CONSTITUTIONAL: Complaining of abdominal pain. Has gained almost 30 pounds.  EYES: No double or blurred vision.  RESPIRATORY: No shortness of breath or cough.  CARDIOVASCULAR: Denies any chest pain or  orthopnea.  GASTROINTESTINAL: Abdominal pain present. Denies nausea, vomiting or diarrhea at this time.  GENITOURINARY: No incontinence or frequency.  ENDOCRINE: No heat or cold intolerance.  LYMPHATIC: Denies any anemia or easy bruising.  INTEGUMENTARY: No acne or rash.  MUSCULOSKELETAL: Denies any muscle or joint pain.  NEUROLOGIC: No tingling or  weakness.   MENTAL STATUS EXAMINATION: The patient is a moderately built female who was lying in the bed. She appeared in distress. Her mood was depressed and anxious. Affect was congruent. Thought process was logical, goal-directed. Thought content was nondelusional. She currently denied having any perceptual disturbances. She has some passive suicidal ideations, but no plans. She denied having any homicidal ideations.   DIAGNOSTIC IMPRESSION: AXIS I: Major depressive disorder, recurrent, severe, without psychotic features.  AXIS II: None.  AXIS III: Status post bypass surgery, gastroesophageal reflux disease, status post bowel obstruction.   TREATMENT PLAN:  1.  I discussed her case at length with the ED physician, Dr. Fanny BienQuale, and he is currently reviewing her medically and will do some testing as well as blood work.  2.  Once she becomes medically stable, she might be a candidate for psychiatric admission. I will start the patient on Ativan 0.5 mg p.o. q.8 hours p.r.n. for her anxiety as well as to prevent her from going into withdrawals from alcohol. The patient will not be started back on Abilify and Celexa at this time. Will continue to monitor the patient in the ED at this time.   Thank you for allowing me to participate in the care of this patient.   ____________________________ Ardeen FillersUzma S. Garnetta BuddyFaheem, MD usf:jm D: 12/26/2012 16:36:21 ET T: 12/26/2012 17:20:29 ET JOB#: [id number]  cc: Ardeen FillersUzma S. Garnetta BuddyFaheem, MD, <Dictator> Rhunette CroftUZMA S Kionte Baumgardner MD ELECTRONICALLY SIGNED 12/28/2012 13:08

## 2015-06-23 ENCOUNTER — Ambulatory Visit: Payer: Self-pay | Admitting: Licensed Clinical Social Worker

## 2016-08-17 IMAGING — CR DG ABDOMEN 2V
2 series · 2 of 2 positions shown · non-contrast
Comparison: None.

CLINICAL DATA: Generalized body aches and abdominal pain with
vomiting and fever for 2 days.

EXAM:
ABDOMEN - 2 VIEW

[abdomen erect]
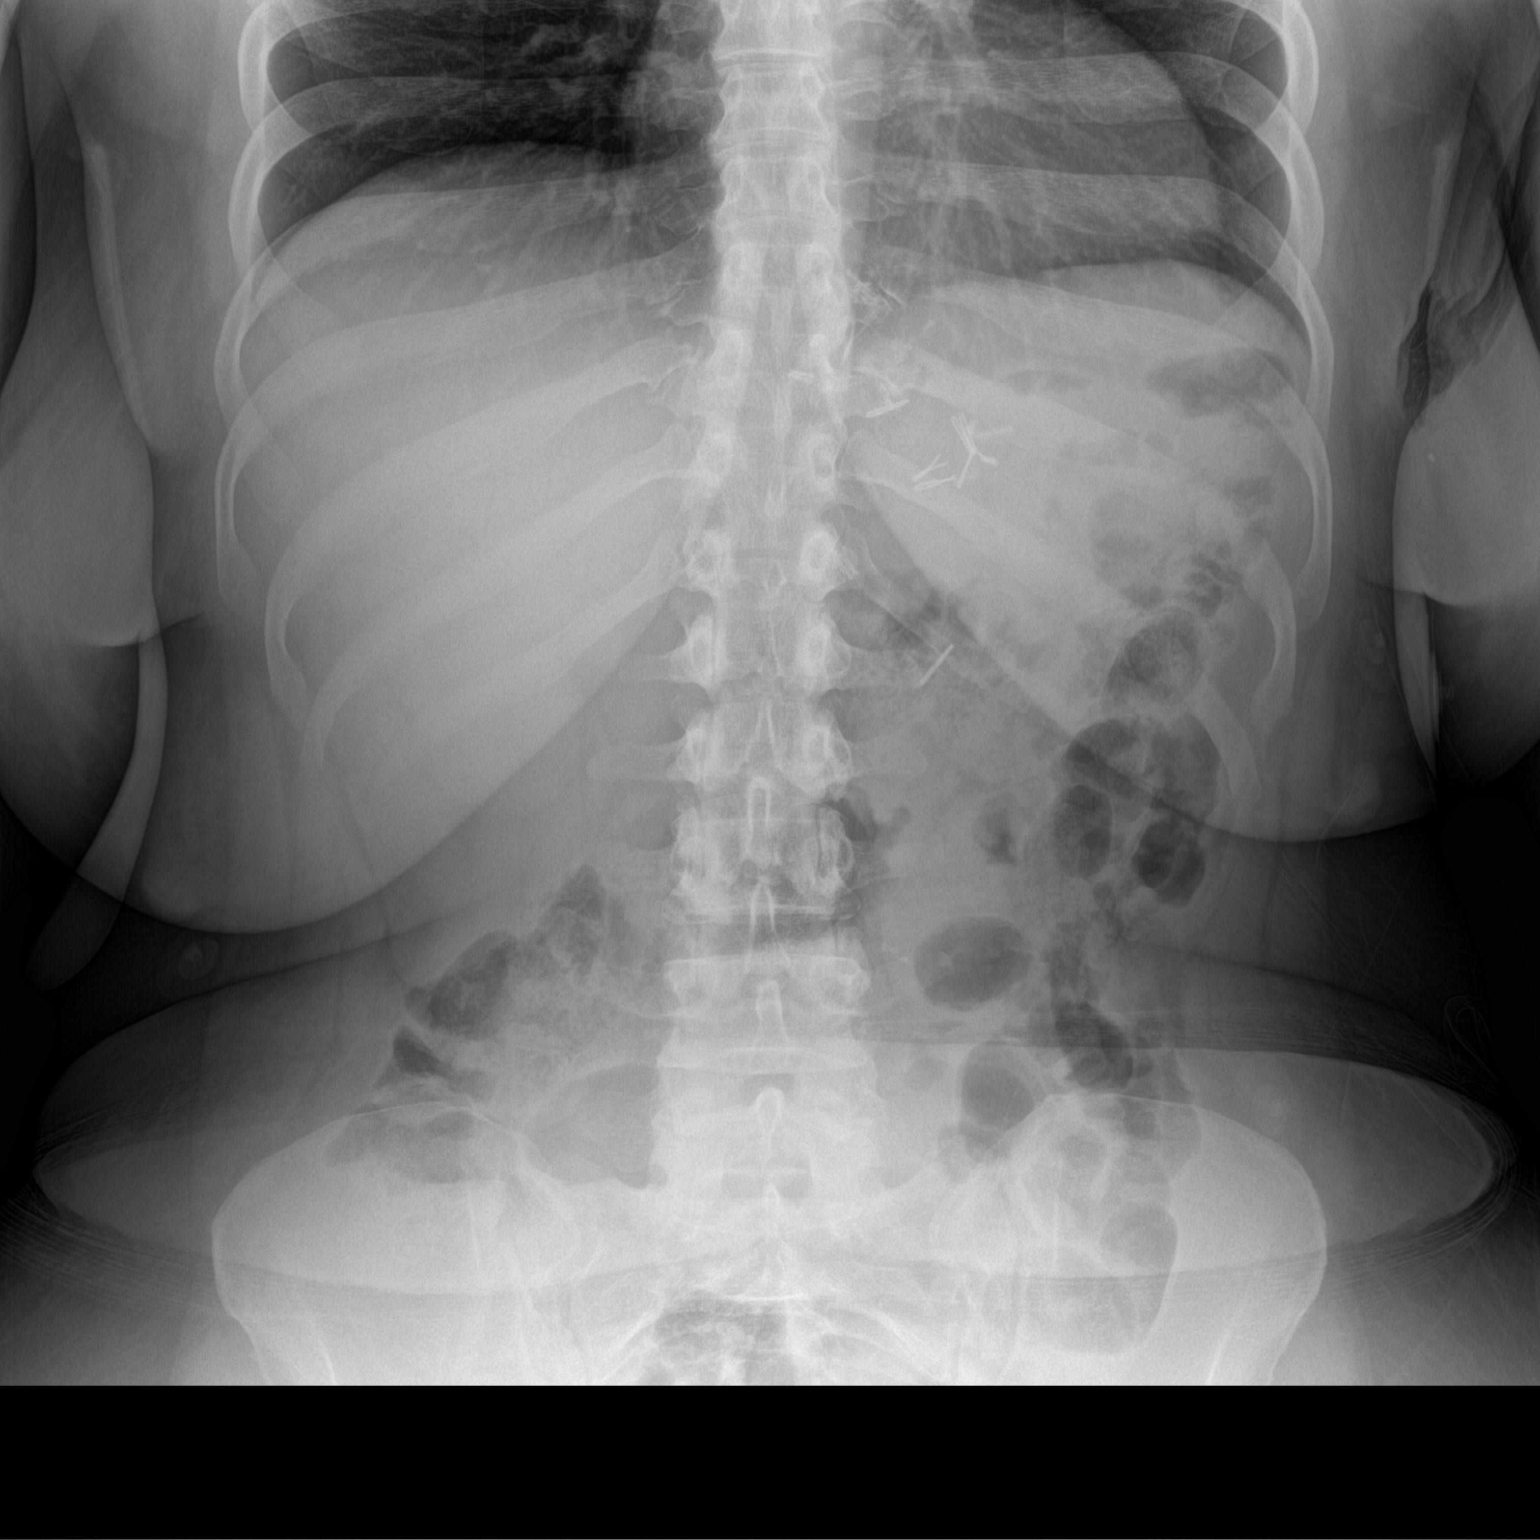

[abdomen supine]
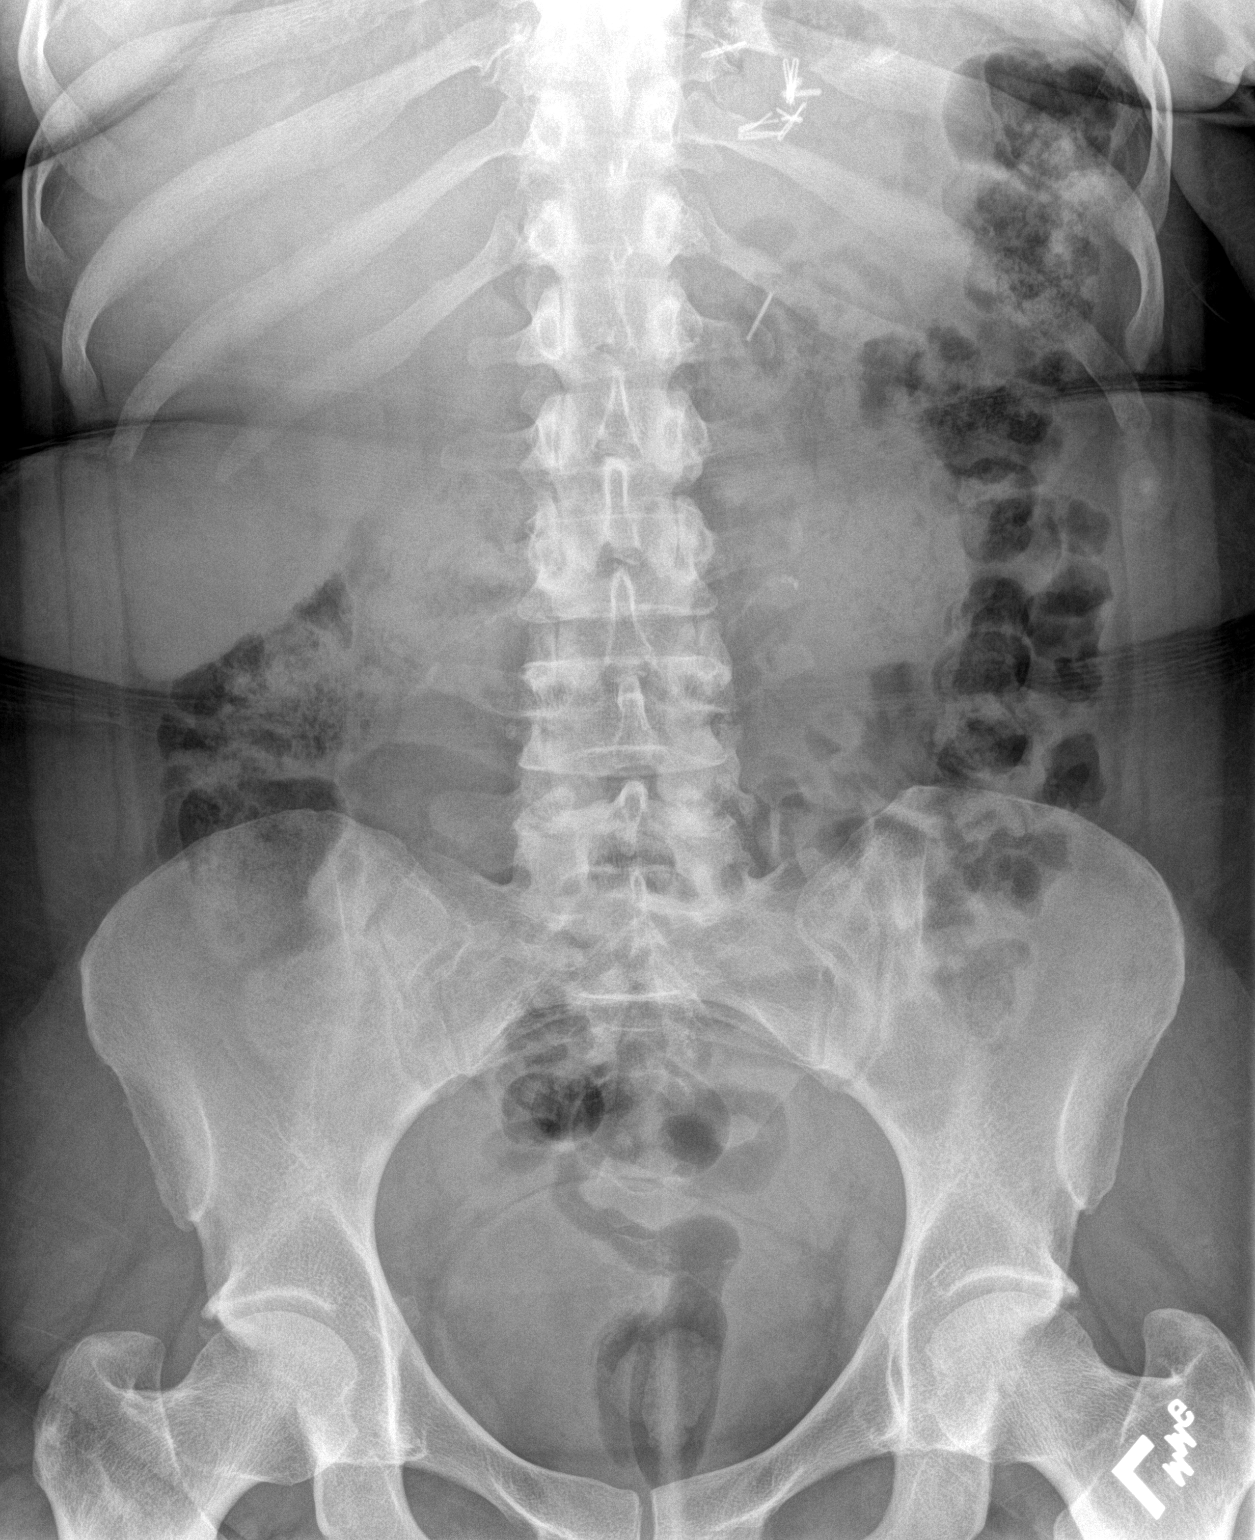

[2 of 2 positions shown; findings below may reference images not displayed]

FINDINGS: Surgical clips at the GE junction. The bowel gas pattern is normal.
There is no evidence of free air. No radio-opaque calculi or other
significant radiographic abnormality is seen.
IMPRESSION: Nonobstructive bowel gas pattern.

## 2016-08-17 IMAGING — CR DG CHEST 2V
2 series · 2 of 2 positions shown · non-contrast
Comparison: None.

CLINICAL DATA: Generalized body aches and abdominal pain with
vomiting and fever for 2 days.

EXAM:
CHEST  2 VIEW

[chest pa]
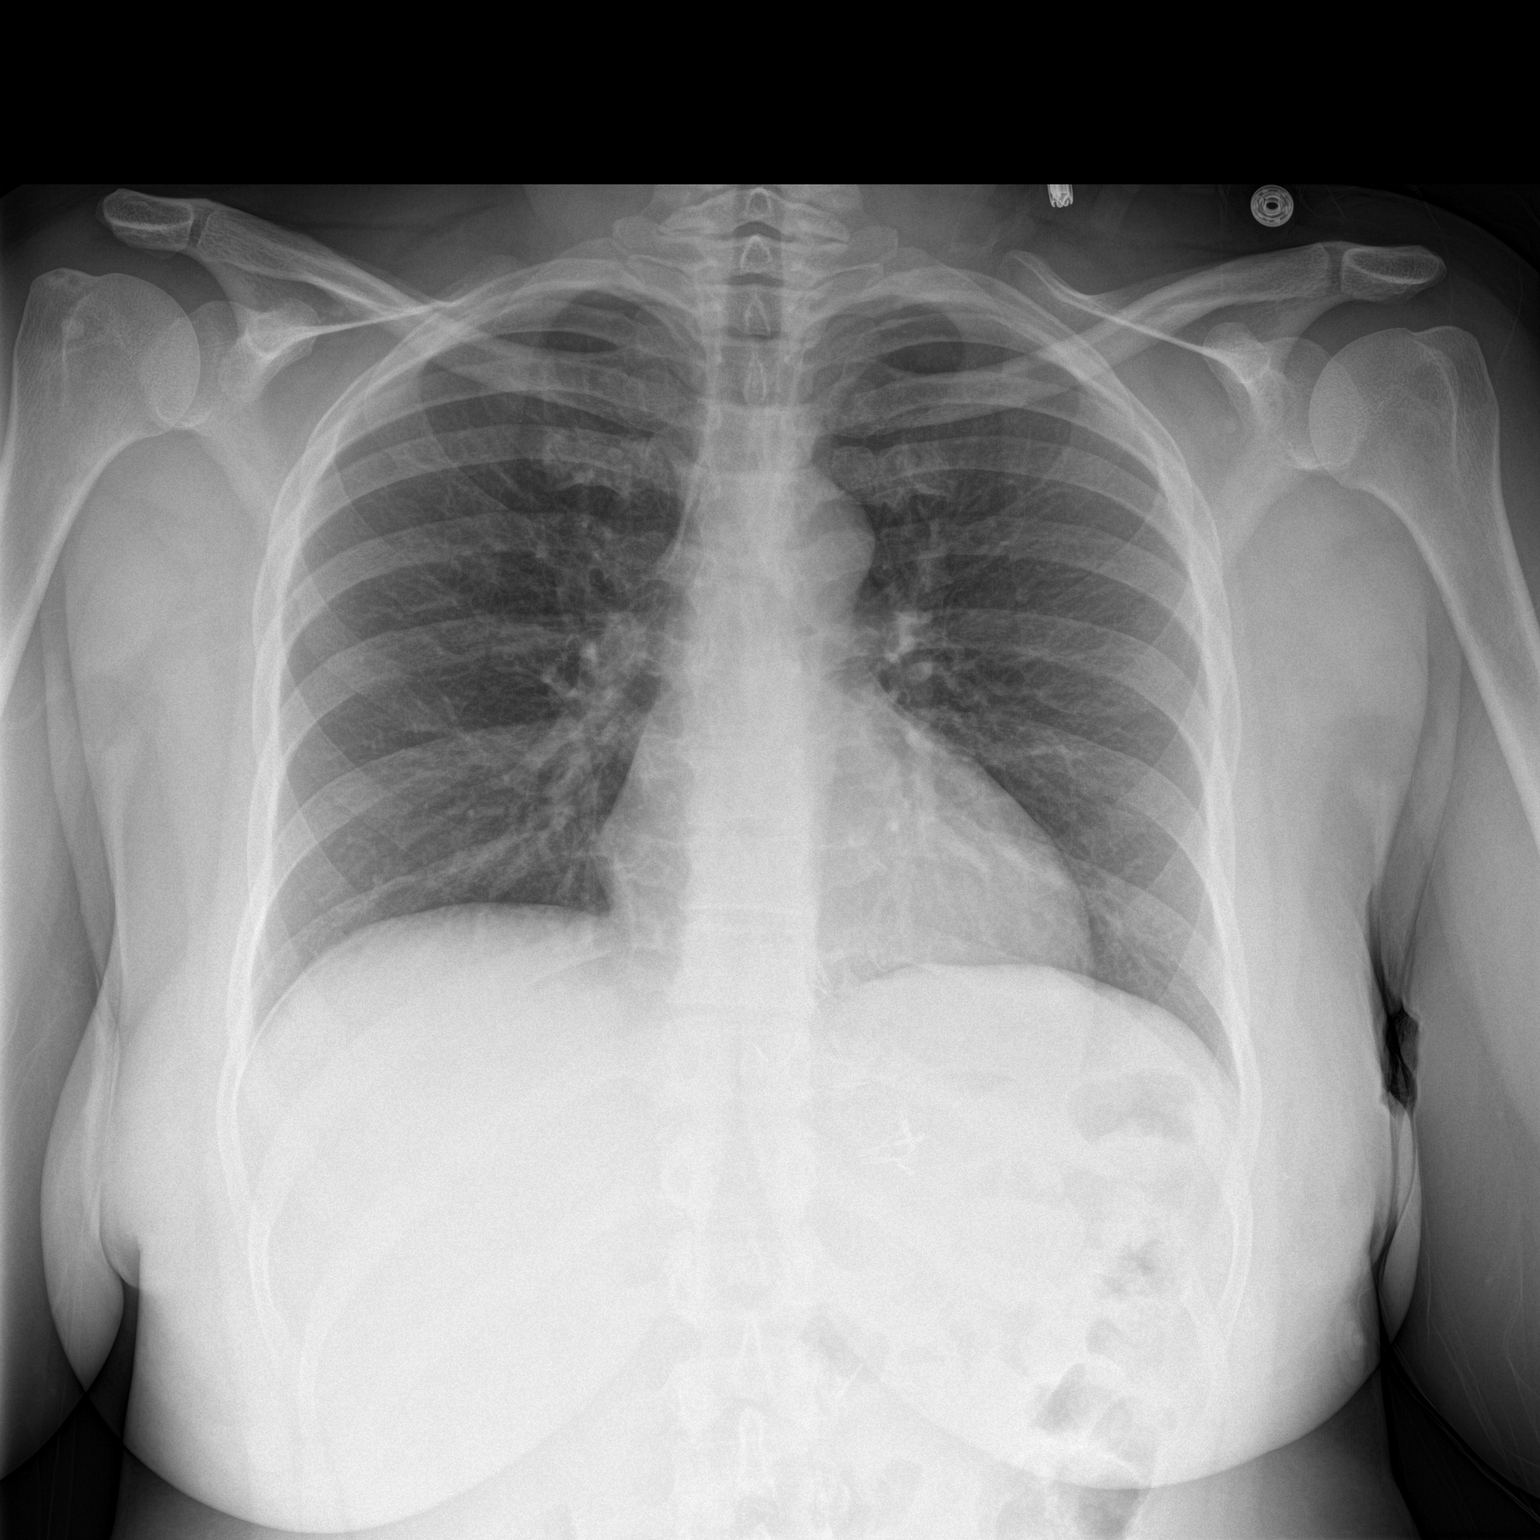

[chest lat]
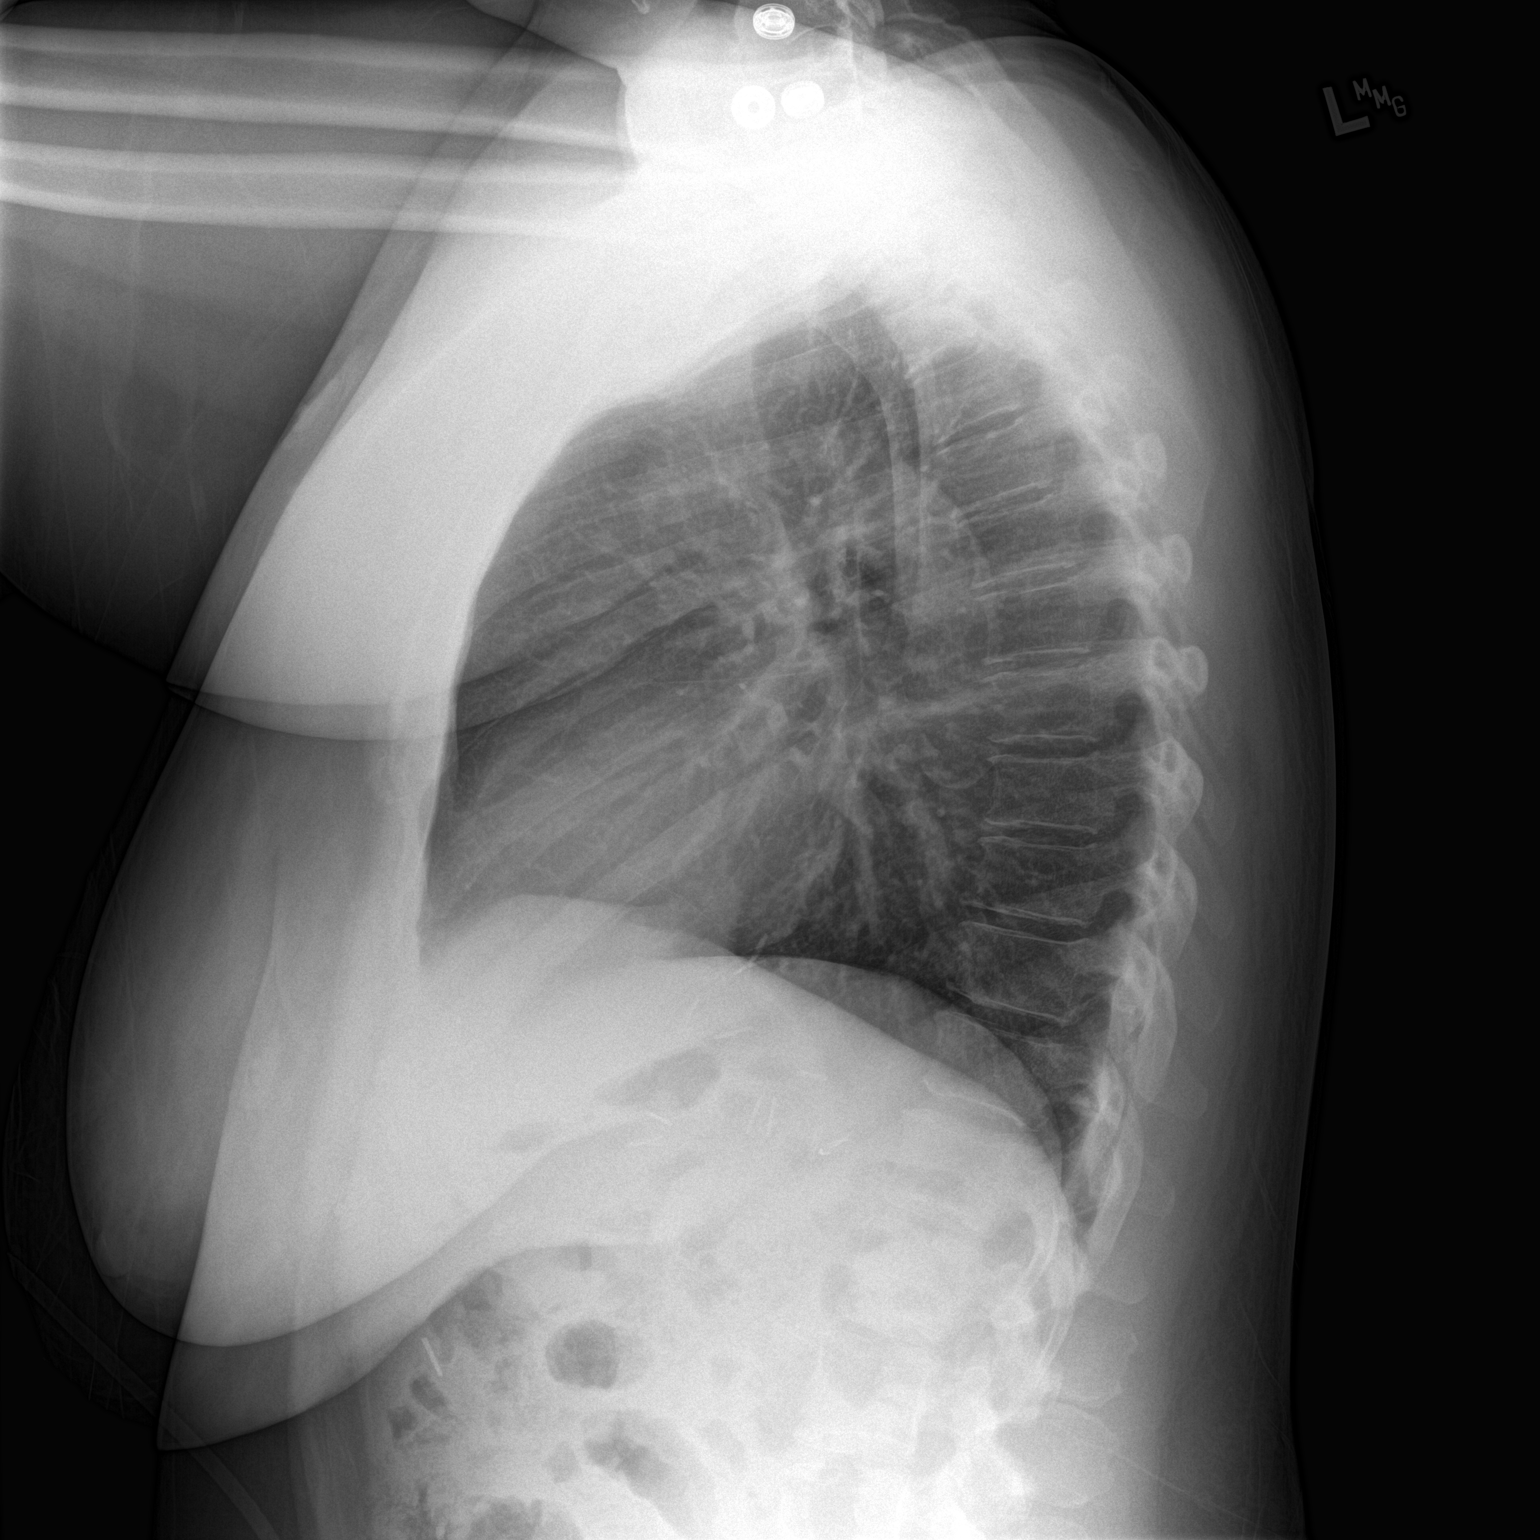

[2 of 2 positions shown; findings below may reference images not displayed]

FINDINGS: The heart size and mediastinal contours are within normal limits.
Both lungs are clear. The visualized skeletal structures are
unremarkable. Surgical clips at the EG junction.
IMPRESSION: No active cardiopulmonary disease.

## 2023-03-18 DEATH — deceased
